# Patient Record
Sex: Female | Born: 1980 | Race: Black or African American | Hispanic: No | Smoking: Current every day smoker
Health system: Southern US, Community
[De-identification: ages and names within clinical notes are randomized; demographics above are authoritative.]

## PROBLEM LIST (undated history)

## (undated) ENCOUNTER — Inpatient Hospital Stay (HOSPITAL_COMMUNITY): Payer: Self-pay

## (undated) DIAGNOSIS — I10 Essential (primary) hypertension: Secondary | ICD-10-CM

## (undated) DIAGNOSIS — A749 Chlamydial infection, unspecified: Secondary | ICD-10-CM

## (undated) HISTORY — PX: WISDOM TOOTH EXTRACTION: SHX21

---

## 2006-07-15 ENCOUNTER — Emergency Department (HOSPITAL_COMMUNITY): Admission: EM | Admit: 2006-07-15 | Discharge: 2006-07-15 | Payer: Self-pay | Admitting: Emergency Medicine

## 2007-04-25 ENCOUNTER — Emergency Department (HOSPITAL_COMMUNITY): Admission: EM | Admit: 2007-04-25 | Discharge: 2007-04-25 | Payer: Self-pay | Admitting: Emergency Medicine

## 2007-10-12 ENCOUNTER — Inpatient Hospital Stay (HOSPITAL_COMMUNITY): Admission: AD | Admit: 2007-10-12 | Discharge: 2007-10-12 | Payer: Self-pay | Admitting: Obstetrics & Gynecology

## 2007-10-14 ENCOUNTER — Inpatient Hospital Stay (HOSPITAL_COMMUNITY): Admission: AD | Admit: 2007-10-14 | Discharge: 2007-10-14 | Payer: Self-pay | Admitting: Obstetrics

## 2007-10-16 ENCOUNTER — Inpatient Hospital Stay (HOSPITAL_COMMUNITY): Admission: AD | Admit: 2007-10-16 | Discharge: 2007-10-16 | Payer: Self-pay | Admitting: Obstetrics

## 2008-01-02 ENCOUNTER — Inpatient Hospital Stay (HOSPITAL_COMMUNITY): Admission: AD | Admit: 2008-01-02 | Discharge: 2008-01-02 | Payer: Self-pay | Admitting: Obstetrics

## 2008-01-14 ENCOUNTER — Inpatient Hospital Stay (HOSPITAL_COMMUNITY): Admission: AD | Admit: 2008-01-14 | Discharge: 2008-01-14 | Payer: Self-pay | Admitting: Obstetrics

## 2008-01-24 ENCOUNTER — Ambulatory Visit (HOSPITAL_COMMUNITY): Admission: RE | Admit: 2008-01-24 | Discharge: 2008-01-24 | Payer: Self-pay | Admitting: Obstetrics

## 2010-01-26 ENCOUNTER — Encounter: Payer: Self-pay | Admitting: Obstetrics

## 2010-04-21 LAB — URINE CULTURE: Colony Count: >=100000

## 2010-04-21 LAB — URINALYSIS, ROUTINE W REFLEX MICROSCOPIC
Nitrite: NEGATIVE
Specific Gravity, Urine: 1.02 (ref 1.005–1.030)

## 2010-04-21 LAB — GC/CHLAMYDIA PROBE AMP, GENITAL: Chlamydia, DNA Probe: NEGATIVE

## 2010-04-21 LAB — WET PREP, GENITAL
Trich, Wet Prep: NONE SEEN
Yeast Wet Prep HPF POC: NONE SEEN

## 2010-10-07 LAB — CBC
HCT: 38.6
Hemoglobin: 12.6
MCHC: 32.7
MCV: 86.7
RDW: 15.6 — ABNORMAL HIGH

## 2010-10-07 LAB — COMPREHENSIVE METABOLIC PANEL
Albumin: 3.7
BUN: 2 — ABNORMAL LOW
Calcium: 9
Chloride: 105
GFR calc Af Amer: >60
Potassium: 3.6
Total Protein: 6.9

## 2010-10-07 LAB — URINE MICROSCOPIC-ADD ON

## 2010-10-07 LAB — WET PREP, GENITAL: Yeast Wet Prep HPF POC: NONE SEEN

## 2010-10-07 LAB — URINALYSIS, ROUTINE W REFLEX MICROSCOPIC
Glucose, UA: NEGATIVE
Ketones, ur: 15 — AB
Specific Gravity, Urine: >1.03 — ABNORMAL HIGH
pH: 6

## 2010-10-07 LAB — HCG, QUANTITATIVE, PREGNANCY: hCG, Beta Chain, Quant, S: 4

## 2010-10-07 LAB — ABO/RH: ABO/RH(D): A POS

## 2010-10-07 LAB — GC/CHLAMYDIA PROBE AMP, GENITAL
Chlamydia, DNA Probe: NEGATIVE
GC Probe Amp, Genital: NEGATIVE

## 2010-10-07 LAB — POCT PREGNANCY, URINE: Preg Test, Ur: POSITIVE

## 2010-10-10 LAB — URINALYSIS, ROUTINE W REFLEX MICROSCOPIC
Bilirubin Urine: NEGATIVE
Glucose, UA: NEGATIVE mg/dL
Ketones, ur: NEGATIVE mg/dL
Nitrite: NEGATIVE
Protein, ur: NEGATIVE mg/dL
Specific Gravity, Urine: 1.025 (ref 1.005–1.030)

## 2015-04-18 DIAGNOSIS — K644 Residual hemorrhoidal skin tags: Secondary | ICD-10-CM | POA: Diagnosis not present

## 2015-04-18 DIAGNOSIS — K625 Hemorrhage of anus and rectum: Secondary | ICD-10-CM | POA: Diagnosis present

## 2015-04-18 NOTE — ED Notes (Signed)
Pt c/o 4 days of hemorrhoid pain with rectal bleeding

## 2015-04-19 ENCOUNTER — Encounter (HOSPITAL_BASED_OUTPATIENT_CLINIC_OR_DEPARTMENT_OTHER): Payer: Self-pay

## 2015-04-19 ENCOUNTER — Emergency Department (HOSPITAL_BASED_OUTPATIENT_CLINIC_OR_DEPARTMENT_OTHER)
Admission: EM | Admit: 2015-04-19 | Discharge: 2015-04-19 | Disposition: A | Discharge status: Home / Self Care | Payer: Medicaid Other | Attending: Emergency Medicine | Admitting: Emergency Medicine

## 2015-04-19 DIAGNOSIS — K644 Residual hemorrhoidal skin tags: Secondary | ICD-10-CM

## 2015-04-19 MED ORDER — LIDOCAINE 4 % EX CREA
TOPICAL_CREAM | CUTANEOUS | Status: DC | PRN
  Administered 2015-04-19: 1 via TOPICAL
  Filled 2015-04-19: qty 5

## 2015-04-19 MED ORDER — HYDROCORTISONE 2.5 % RE CREA: TOPICAL_CREAM | RECTAL | Status: DC

## 2015-04-19 MED ORDER — LIDOCAINE 4 % EX CREA: 1.0000 "application " | TOPICAL_CREAM | CUTANEOUS | Status: DC | PRN

## 2015-04-19 NOTE — ED Provider Notes (Signed)
CSN: [id number]     Arrival date & time 04/18/15  2353 History   First MD Initiated Contact with Patient 04/19/15 0019     Chief Complaint  Patient presents with  . Rectal Pain     (Consider location/radiation/quality/duration/timing/severity/associated sxs/prior Treatment) HPI  This is a 35 year old female with a history of hemorrhoids since having a baby 1 year ago. She is here with 4 days of enlargement and pain in her hemorrhoids. She rates her pain as severe, worse with sitting or bowel movements. She has tried multiple over-the-counter medications without relief. There has been some associated bleeding. She denies abdominal pain.  History reviewed. No pertinent past medical history. History reviewed. No pertinent past surgical history. No family history on file. Social History  Substance Use Topics  . Smoking status: None  . Smokeless tobacco: None  . Alcohol Use: None   OB History    No data available     Review of Systems  All other systems reviewed and are negative.   Allergies  Review of patient's allergies indicates no known allergies.  Home Medications   Prior to Admission medications   Not on File   BP 124/85 mmHg  Pulse 66  Temp(Src) 98.3 F (36.8 C) (Oral)  Resp 18  Ht 5\' 6"  (1.676 m)  Wt 177 lb (80.287 kg)  BMI 28.58 kg/m2  SpO2 99%  LMP 04/12/2015 (Approximate)   Physical Exam  General: Well-developed, well-nourished female in no acute distress; appearance consistent with age of record HENT: normocephalic; atraumatic Eyes: Normal appearance Neck: supple Heart: regular rate and rhythm Lungs: clear to auscultation bilaterally Abdomen: soft; nondistended; nontender Rectal: Two tender, inflamed, nonthrombosed external hemorrhoids Extremities: No deformity; full range of motion Neurologic: Awake, alert and oriented; motor function intact in all extremities and symmetric; no facial droop Skin: Warm and dry Psychiatric: Normal mood and  affect    ED Course  Procedures (including critical care time)   MDM     Paula LibraJohn Juna Caban, MD 04/19/15 [id number]

## 2015-04-19 NOTE — ED Notes (Signed)
MD at bedside. 

## 2015-04-19 NOTE — Discharge Instructions (Signed)

## 2015-11-26 ENCOUNTER — Encounter (HOSPITAL_COMMUNITY): Payer: Self-pay

## 2015-11-26 ENCOUNTER — Inpatient Hospital Stay (HOSPITAL_COMMUNITY)
Admission: AD | Admit: 2015-11-26 | Discharge: 2015-11-26 | Disposition: A | Discharge status: Home / Self Care | Payer: Medicaid Other | Source: Ambulatory Visit | Attending: Obstetrics & Gynecology | Admitting: Obstetrics & Gynecology

## 2015-11-26 ENCOUNTER — Inpatient Hospital Stay (HOSPITAL_COMMUNITY): Payer: Medicaid Other

## 2015-11-26 DIAGNOSIS — N760 Acute vaginitis: Secondary | ICD-10-CM | POA: Insufficient documentation

## 2015-11-26 DIAGNOSIS — O2312 Infections of bladder in pregnancy, second trimester: Secondary | ICD-10-CM | POA: Diagnosis not present

## 2015-11-26 DIAGNOSIS — Z3A19 19 weeks gestation of pregnancy: Secondary | ICD-10-CM | POA: Diagnosis not present

## 2015-11-26 DIAGNOSIS — O4692 Antepartum hemorrhage, unspecified, second trimester: Secondary | ICD-10-CM

## 2015-11-26 DIAGNOSIS — N3000 Acute cystitis without hematuria: Secondary | ICD-10-CM | POA: Insufficient documentation

## 2015-11-26 DIAGNOSIS — O209 Hemorrhage in early pregnancy, unspecified: Secondary | ICD-10-CM | POA: Diagnosis not present

## 2015-11-26 DIAGNOSIS — F17210 Nicotine dependence, cigarettes, uncomplicated: Secondary | ICD-10-CM | POA: Insufficient documentation

## 2015-11-26 DIAGNOSIS — O23592 Infection of other part of genital tract in pregnancy, second trimester: Secondary | ICD-10-CM | POA: Diagnosis not present

## 2015-11-26 DIAGNOSIS — O99332 Smoking (tobacco) complicating pregnancy, second trimester: Secondary | ICD-10-CM | POA: Diagnosis not present

## 2015-11-26 DIAGNOSIS — B9689 Other specified bacterial agents as the cause of diseases classified elsewhere: Secondary | ICD-10-CM | POA: Diagnosis not present

## 2015-11-26 DIAGNOSIS — R109 Unspecified abdominal pain: Secondary | ICD-10-CM | POA: Diagnosis present

## 2015-11-26 HISTORY — DX: Essential (primary) hypertension: I10

## 2015-11-26 LAB — URINE MICROSCOPIC-ADD ON

## 2015-11-26 LAB — URINALYSIS, ROUTINE W REFLEX MICROSCOPIC
BILIRUBIN URINE: NEGATIVE
Glucose, UA: NEGATIVE mg/dL
Ketones, ur: 15 mg/dL — AB
Nitrite: POSITIVE — AB
PH: 7.5 (ref 5.0–8.0)
Protein, ur: 100 mg/dL — AB
SPECIFIC GRAVITY, URINE: 1.015 (ref 1.005–1.030)

## 2015-11-26 LAB — WET PREP, GENITAL
SPERM: NONE SEEN
Trich, Wet Prep: NONE SEEN
YEAST WET PREP: NONE SEEN

## 2015-11-26 MED ORDER — NITROFURANTOIN MONOHYD MACRO 100 MG PO CAPS
100.0000 mg | ORAL_CAPSULE | Freq: Once | ORAL | Status: AC
  Administered 2015-11-26: 100 mg via ORAL
  Filled 2015-11-26: qty 1

## 2015-11-26 MED ORDER — METRONIDAZOLE 500 MG PO TABS: 500.0000 mg | ORAL_TABLET | Freq: Two times a day (BID) | ORAL | 0 refills | Status: DC

## 2015-11-26 MED ORDER — NITROFURANTOIN MONOHYD MACRO 100 MG PO CAPS: 100.0000 mg | ORAL_CAPSULE | Freq: Two times a day (BID) | ORAL | 0 refills | Status: DC

## 2015-11-26 NOTE — MAU Provider Note (Signed)
History     CSN: [id number]  Arrival date and time: 11/26/15 1736   First Provider Initiated Contact with Patient 11/26/15 1807      Chief Complaint  Patient presents with  . Abdominal Pain  . Vaginal Bleeding   HPI  Lisa Hunt is a 35 y.o. [id number] at 6346w5d who presents with abdominal pain & vaginal bleeding. Patient reports symptoms of UTI for the last 3 days, complaining of urgency & frequency. Denies dysuria, hematuria, fever/chills, or flank pain.  Lower abdominal pain since this morning that is constant & cramp like. Rates pain 7/10. Has not treated. Nothing makes better or worse. Denies n/v/d, LOF, or vaginal discharge. Some vaginal spotting today that appears bright red. Passed small bright red blot that was the size of a nickel. Last intercourse yesterday. Last BM was this morning;had to strain. Has first prenatal visit with [facility] tomorrow.  OB History    Gravida Para Term Preterm AB Living   10       5 4    SAB TAB Ectopic Multiple Live Births   2 3            Past Medical History:  Diagnosis Date  . Hypertension     Past Surgical History:  Procedure Laterality Date  . WISDOM TOOTH EXTRACTION      History reviewed. No pertinent family history.  Social History  Substance Use Topics  . Smoking status: Current Every Day Smoker    Packs/day: 0.25    Types: Cigarettes  . Smokeless tobacco: Current User  . Alcohol use No    Allergies: No Known Allergies  No prescriptions prior to admission.    Review of Systems  Constitutional: Negative for chills and fever.  Gastrointestinal: Positive for abdominal pain and constipation. Negative for diarrhea, nausea and vomiting.  Genitourinary: Positive for frequency and urgency. Negative for dysuria, flank pain and hematuria.       + vaginal bleeding  Musculoskeletal: Negative for back pain.   Physical Exam   Blood pressure 125/81, pulse 72, temperature 98.2 F (36.8 C), temperature source Oral, resp. rate  18, height 5\' 6"  (1.676 m), weight 190 lb (86.2 kg), last menstrual period 07/11/2015.  Physical Exam  Nursing note and vitals reviewed. Constitutional: She is oriented to person, place, and time. She appears well-developed and well-nourished. No distress.  HENT:  Head: Normocephalic and atraumatic.  Eyes: Conjunctivae are normal. Right eye exhibits no discharge. Left eye exhibits no discharge. No scleral icterus.  Neck: Normal range of motion.  Cardiovascular: Normal rate, regular rhythm and normal heart sounds.   No murmur heard. Respiratory: Effort normal and breath sounds normal. No respiratory distress. She has no wheezes.  GI: Soft. There is no tenderness.  Fundal height at umbilicus  Genitourinary: No bleeding in the vagina. Vaginal discharge (small amount of yellow frothy discharge) found.  Genitourinary Comments: Cervix visually closed  Neurological: She is alert and oriented to person, place, and time.  Skin: Skin is warm and dry. She is not diaphoretic.  Psychiatric: She has a normal mood and affect. Her behavior is normal. Judgment and thought content normal.    MAU Course  Procedures Results for orders placed or performed during the hospital encounter of 11/26/15 (from the past 24 hour(s))  Urinalysis, Routine w reflex microscopic (not [facility])     Status: Abnormal   Collection Time: 11/26/15  5:58 PM  Result Value Ref Range   Color, Urine YELLOW YELLOW   APPearance CLOUDY (A)  CLEAR   Specific Gravity, Urine 1.015 1.005 - 1.030   pH 7.5 5.0 - 8.0   Glucose, UA NEGATIVE NEGATIVE mg/dL   Hgb urine dipstick LARGE (A) NEGATIVE   Bilirubin Urine NEGATIVE NEGATIVE   Ketones, ur 15 (A) NEGATIVE mg/dL   Protein, ur 657100 (A) NEGATIVE mg/dL   Nitrite POSITIVE (A) NEGATIVE   Leukocytes, UA LARGE (A) NEGATIVE  Urine microscopic-add on     Status: Abnormal   Collection Time: 11/26/15  5:58 PM  Result Value Ref Range   Squamous Epithelial / LPF 0-5 (A) NONE SEEN   WBC, UA TOO  NUMEROUS TO COUNT 0 - 5 WBC/hpf   RBC / HPF 6-30 0 - 5 RBC/hpf   Bacteria, UA FEW (A) NONE SEEN  Wet prep, genital     Status: Abnormal   Collection Time: 11/26/15  6:18 PM  Result Value Ref Range   Yeast Wet Prep HPF POC NONE SEEN NONE SEEN   Trich, Wet Prep NONE SEEN NONE SEEN   Clue Cells Wet Prep HPF POC PRESENT (A) NONE SEEN   WBC, Wet Prep HPF POC MANY (A) NONE SEEN   Sperm NONE SEEN     MDM FHT 160 by doppler GC/CT & wet prep A positive Ultrasound ordered d/t bleeding -- no evidence of previa or abruption; normal cervical length Pt has transportation issues & can't p/u abx tonight; will give first dose of macrobid prior to discharge  Assessment and Plan  A:  1. Acute cystitis during pregnancy in second trimester   2. [redacted] weeks gestation of pregnancy   3. Vaginal bleeding in pregnancy, second trimester   4. BV (bacterial vaginosis)    P: Discharge home Rx flagyl & macrobid Urine culture pending GC/Ct pending Discussed reasons to return to MAU Keep ob appt tomorrow  Judeth Hornrin Donnelle Rubey 11/26/2015, 6:07 PM

## 2015-11-26 NOTE — MAU Note (Signed)
Pt presents to MAU by EMS with abdominal cramping/pressure and spotting. Spotting began yesterday, and passed one quarter sized clot today. Reports bleeding is only when wiping but, pt noticed blood is now a brighter red. Lower abdominal pain started today. States, it feels like she has a UTI. Denies any abnormal discharge or leaking of fluid.

## 2015-11-26 NOTE — Discharge Instructions (Signed)
Pregnancy and Urinary Tract Infection °WHAT IS A URINARY TRACT INFECTION? °A urinary tract infection (UTI) is an infection of any part of the urinary tract. This includes the kidneys, the tubes that connect your kidneys to your bladder (ureters), the bladder, and the tube that carries urine out of your body (urethra). These organs make, store, and get rid of urine in the body. A UTI can be a bladder infection (cystitis) or a kidney infection (pyelonephritis). This infection may be caused by fungi, viruses, and bacteria. Bacteria are the most common cause of UTIs. °You are more likely to develop a UTI during pregnancy because: °· The physical and hormonal changes your body goes through can make it easier for bacteria to get into your urinary tract. °· Your growing baby puts pressure on your uterus and can affect urine flow. °DOES A UTI PLACE MY BABY AT RISK? °An untreated UTI during pregnancy could lead to a kidney infection, which can cause health problems that could affect your baby. Possible complications of an untreated UTI include: °· Having your baby before 37 weeks of pregnancy (premature). °· Having a baby with a low birth weight. °· Developing high blood pressure during pregnancy (preeclampsia). °WHAT ARE THE SYMPTOMS OF A UTI? °Symptoms of a UTI include: °· Fever. °· Frequent urination or passing small amounts of urine frequently. °· Needing to urinate urgently. °· Pain or a burning sensation with urination. °· Urine that smells bad or unusual. °· Cloudy urine. °· Pain in the lower abdomen or back. °· Trouble urinating. °· Blood in the urine. °· Vomiting or being less hungry than normal. °· Diarrhea or abdominal pain. °· Vaginal discharge. °WHAT ARE THE TREATMENT OPTIONS FOR A UTI DURING PREGNANCY? °Treatment for this condition may include: °· Antibiotic medicines that are safe to take during pregnancy. °· Other medicines to treat less common causes of UTI. °HOW CAN I PREVENT A UTI? °To prevent a UTI: °· Go  to the bathroom as soon as you feel the need. °· Always wipe from front to back. °· Wash your genital area with soap and warm water daily. °· Empty your bladder before and after sex. °· Wear cotton underwear. °· Limit your intake of high sugar foods or drinks, such as regular soda, juice, and sweets.. °· Drink 6-8 glasses of water daily. °· Do not wear tight-fitting pants. °· Do not douche or use deodorant sprays. °· Do not drink alcohol, caffeine, or carbonated drinks. These can irritate the bladder. °WHEN SHOULD I SEEK MEDICAL CARE? °Seek medical care if: °· Your symptoms do not improve or get worse. °· You have a fever after two days of treatment. °· You have a rash. °· You have abnormal vaginal discharge. °· You have back or side pain. °· You have chills. °· You have nausea and vomiting. °WHEN SHOULD I SEEK IMMEDIATE MEDICAL CARE? °Seek immediate medical care if you are pregnant and: °· You feel contractions in your uterus. °· You have lower belly pain. °· You have a gush of fluid from your vagina. °· You have blood in your urine. °· You are vomiting and cannot keep down any medicines or water. °This information is not intended to replace advice given to you by your health care provider. Make sure you discuss any questions you have with your health care provider. °Document Released: 04/18/2010 Document Revised: 05/27/2015 Document Reviewed: 11/12/2014 °Elsevier Interactive Patient Education © 2017 [employer] ° °

## 2015-11-27 ENCOUNTER — Telehealth (HOSPITAL_COMMUNITY): Payer: Self-pay | Admitting: *Deleted

## 2015-11-27 LAB — GC/CHLAMYDIA PROBE AMP (~~LOC~~) NOT AT ARMC
CHLAMYDIA, DNA PROBE: POSITIVE — AB
Neisseria Gonorrhea: NEGATIVE

## 2015-11-27 NOTE — Telephone Encounter (Signed)
Attempted to call pt. To inform of positive [place] results, no answer or message available on all contact phone numbers.  Will notify provider and send certified mail.  Form completed and sent to Health dept.

## 2015-11-29 LAB — CULTURE, OB URINE: Culture: >=100000 — AB

## 2015-11-30 ENCOUNTER — Telehealth: Payer: Self-pay | Admitting: Student

## 2015-11-30 DIAGNOSIS — O2342 Unspecified infection of urinary tract in pregnancy, second trimester: Secondary | ICD-10-CM

## 2015-11-30 MED ORDER — CEPHALEXIN 500 MG PO CAPS: 500.0000 mg | ORAL_CAPSULE | Freq: Four times a day (QID) | ORAL | 0 refills | Status: DC

## 2015-11-30 NOTE — Telephone Encounter (Signed)
Needs change in therapy d/t results of urine culture. Attempted to contact patient. No answer & no option for voicemail. New rx sent to pharmacy on file.

## 2015-12-06 ENCOUNTER — Encounter (HOSPITAL_COMMUNITY): Payer: Self-pay

## 2015-12-16 LAB — OB RESULTS CONSOLE HEPATITIS B SURFACE ANTIGEN: Hepatitis B Surface Ag: NEGATIVE

## 2015-12-16 LAB — OB RESULTS CONSOLE RUBELLA ANTIBODY, IGM: Rubella: IMMUNE

## 2015-12-16 LAB — OB RESULTS CONSOLE HIV ANTIBODY (ROUTINE TESTING): HIV: NONREACTIVE

## 2015-12-16 LAB — OB RESULTS CONSOLE RPR: RPR: NONREACTIVE

## 2015-12-17 ENCOUNTER — Other Ambulatory Visit: Payer: Self-pay | Admitting: Nurse Practitioner

## 2015-12-25 ENCOUNTER — Other Ambulatory Visit: Payer: Self-pay | Admitting: Obstetrics & Gynecology

## 2015-12-25 DIAGNOSIS — Z3689 Encounter for other specified antenatal screening: Secondary | ICD-10-CM

## 2016-01-03 ENCOUNTER — Other Ambulatory Visit: Payer: Self-pay | Admitting: Obstetrics & Gynecology

## 2016-01-03 ENCOUNTER — Ambulatory Visit (HOSPITAL_COMMUNITY)
Admission: RE | Admit: 2016-01-03 | Discharge: 2016-01-03 | Disposition: A | Discharge status: Home / Self Care | Payer: Medicaid Other | Source: Ambulatory Visit | Attending: Obstetrics & Gynecology | Admitting: Obstetrics & Gynecology

## 2016-01-03 DIAGNOSIS — O402XX0 Polyhydramnios, second trimester, not applicable or unspecified: Secondary | ICD-10-CM

## 2016-01-03 DIAGNOSIS — Z363 Encounter for antenatal screening for malformations: Secondary | ICD-10-CM | POA: Diagnosis not present

## 2016-01-03 DIAGNOSIS — Z3A25 25 weeks gestation of pregnancy: Secondary | ICD-10-CM

## 2016-01-03 DIAGNOSIS — Z3689 Encounter for other specified antenatal screening: Secondary | ICD-10-CM

## 2016-01-06 NOTE — L&D Delivery Note (Signed)
36 yo G10 now P5 at [redacted]w[redacted]d admitted in labor. Augmented with AROM.  Delivery Note At 7:43 PM a viable female was delivered via Vaginal, Spontaneous Delivery s/p AROM Presentation:ROA .  APGAR:9 ,9 ; weight pending  .   Placenta status: Delivery intact with gentle traction , .  Cord: no nuchal cord  with the following complications: .  Cord pH: pending   Anesthesia: Epidural   Episiotomy: None Lacerations: None Est. Blood Loss (mL): 150  Mom to postpartum.  Baby to Couplet care / Skin to Skin.  Abdoulaye Diallo 04/06/2016, 7:51 PM  OB FELLOW DELIVERY ATTESTATION  I was gloved and present for the delivery in its entirety, and I agree with the above resident's note.    Ernestina Penna, MD 8:34 PM

## 2016-01-07 ENCOUNTER — Other Ambulatory Visit (HOSPITAL_COMMUNITY): Payer: Self-pay | Admitting: *Deleted

## 2016-01-07 DIAGNOSIS — O409XX0 Polyhydramnios, unspecified trimester, not applicable or unspecified: Secondary | ICD-10-CM

## 2016-01-24 ENCOUNTER — Ambulatory Visit (HOSPITAL_COMMUNITY): Payer: Medicaid Other

## 2016-01-27 ENCOUNTER — Encounter (HOSPITAL_COMMUNITY): Payer: Self-pay

## 2016-01-28 ENCOUNTER — Encounter (HOSPITAL_COMMUNITY): Payer: Self-pay

## 2016-01-28 ENCOUNTER — Inpatient Hospital Stay (HOSPITAL_COMMUNITY)
Admission: RE | Admit: 2016-01-28 | Discharge: 2016-01-28 | Disposition: A | Discharge status: Home / Self Care | Payer: Medicaid Other | Source: Ambulatory Visit | Attending: Maternal and Fetal Medicine | Admitting: Maternal and Fetal Medicine

## 2016-02-07 LAB — OB RESULTS CONSOLE GC/CHLAMYDIA
CHLAMYDIA, DNA PROBE: NEGATIVE
GC PROBE AMP, GENITAL: NEGATIVE

## 2016-02-17 ENCOUNTER — Inpatient Hospital Stay (HOSPITAL_COMMUNITY)
Admission: AD | Admit: 2016-02-17 | Discharge: 2016-02-17 | Disposition: A | Discharge status: Home / Self Care | Payer: Medicaid Other | Source: Ambulatory Visit | Attending: Obstetrics and Gynecology | Admitting: Obstetrics and Gynecology

## 2016-02-17 ENCOUNTER — Encounter (HOSPITAL_COMMUNITY): Payer: Self-pay

## 2016-02-17 DIAGNOSIS — W1839XA Other fall on same level, initial encounter: Secondary | ICD-10-CM | POA: Insufficient documentation

## 2016-02-17 DIAGNOSIS — F17210 Nicotine dependence, cigarettes, uncomplicated: Secondary | ICD-10-CM | POA: Insufficient documentation

## 2016-02-17 DIAGNOSIS — Z3A31 31 weeks gestation of pregnancy: Secondary | ICD-10-CM | POA: Insufficient documentation

## 2016-02-17 DIAGNOSIS — O26893 Other specified pregnancy related conditions, third trimester: Secondary | ICD-10-CM | POA: Insufficient documentation

## 2016-02-17 DIAGNOSIS — O99333 Smoking (tobacco) complicating pregnancy, third trimester: Secondary | ICD-10-CM | POA: Insufficient documentation

## 2016-02-17 LAB — URINALYSIS, ROUTINE W REFLEX MICROSCOPIC
BILIRUBIN URINE: NEGATIVE
Glucose, UA: NEGATIVE mg/dL
Hgb urine dipstick: NEGATIVE
KETONES UR: 5 mg/dL — AB
Leukocytes, UA: NEGATIVE
Nitrite: NEGATIVE
Protein, ur: 30 mg/dL — AB
Specific Gravity, Urine: 1.019 (ref 1.005–1.030)
pH: 6 (ref 5.0–8.0)

## 2016-02-17 NOTE — MAU Provider Note (Signed)
Chief Complaint:  Fall   First Provider Initiated Contact with Patient 02/17/16 2329      HPI: Lisa Hunt is a 36 y.o. [id number] at 55w4dwho presents to maternity admissions reporting she fell while playing with her kids at home today and hit her abdomen on the couch.  She reports some abdominal pain and pelvic pressure that are new onset after her fall.  The pain is intermittent,low in her pelvis and intermittent, increased with movement, and relieved by rest.   She reports good fetal movement, denies LOF, vaginal bleeding, vaginal itching/burning, urinary symptoms, h/a, dizziness, n/v, or fever/chills.    HPI  Past Medical History: Past Medical History:  Diagnosis Date  . Hypertension     Past obstetric history: OB History  Gravida Para Term Preterm AB Living  10 4 4   5 4   SAB TAB Ectopic Multiple Live Births  2 3          # Outcome Date GA Lbr Len/2nd Weight Sex Delivery Anes PTL Lv  10 Current           9 Term           8 Term           7 Term           6 Term           5 TAB           4 TAB           3 TAB           2 SAB           1 SAB               Past Surgical History: Past Surgical History:  Procedure Laterality Date  . WISDOM TOOTH EXTRACTION      Family History: No family history on file.  Social History: Social History  Substance Use Topics  . Smoking status: Current Every Day Smoker    Packs/day: 0.25    Types: Cigarettes  . Smokeless tobacco: Current User  . Alcohol use No    Allergies: No Known Allergies  Meds:  Prescriptions Prior to Admission  Medication Sig Dispense Refill Last Dose  . clotrimazole (GYNE-LOTRIMIN) 1 % vaginal cream Place 1 Applicatorful vaginally at bedtime.   02/17/2016 at Unknown time  . acetaminophen (TYLENOL) 500 MG tablet Take 500 mg by mouth every 6 (six) hours as needed for mild pain.   11/26/2015 at 1430  . cephALEXin (KEFLEX) 500 MG capsule Take 1 capsule (500 mg total) by mouth 4 (four) times daily. 28  capsule 0   . metroNIDAZOLE (FLAGYL) 500 MG tablet Take 1 tablet (500 mg total) by mouth 2 (two) times daily. 14 tablet 0     ROS:  Review of Systems  Constitutional: Negative for chills, fatigue and fever.  Eyes: Negative for visual disturbance.  Respiratory: Negative for shortness of breath.   Cardiovascular: Negative for chest pain.  Gastrointestinal: Positive for abdominal pain. Negative for nausea and vomiting.  Genitourinary: Positive for pelvic pain. Negative for difficulty urinating, dysuria, flank pain, vaginal bleeding, vaginal discharge and vaginal pain.  Neurological: Negative for dizziness and headaches.  Psychiatric/Behavioral: Negative.      I have reviewed patient's Past Medical Hx, Surgical Hx, Family Hx, Social Hx, medications and allergies.   Physical Exam   Patient Vitals for the past 24 hrs:  BP Temp Temp src Pulse Resp SpO2  02/17/16 2213 129/89 98.1 F (36.7 C) Oral 85 16 97 %   Constitutional: Well-developed, well-nourished female in no acute distress.  Cardiovascular: normal rate Respiratory: normal effort GI: Abd soft, non-tender, gravid appropriate for gestational age.  MS: Extremities nontender, no edema, normal ROM Neurologic: Alert and oriented x 4.  GU: Neg CVAT.    Dilation: Fingertip Effacement (%): Thick Cervical Position: Posterior Exam by:: Leftwich-Kirby, CNM  FHT:  Baseline 135 , moderate variability, accelerations present, no decelerations Contractions: None on toco or to palpation   Labs: Results for orders placed or performed during the hospital encounter of 02/17/16 (from the past 24 hour(s))  Urinalysis, Routine w reflex microscopic     Status: Abnormal   Collection Time: 02/17/16 10:00 PM  Result Value Ref Range   Color, Urine YELLOW YELLOW   APPearance HAZY (A) CLEAR   Specific Gravity, Urine 1.019 1.005 - 1.030   pH 6.0 5.0 - 8.0   Glucose, UA NEGATIVE NEGATIVE mg/dL   Hgb urine dipstick NEGATIVE NEGATIVE   Bilirubin  Urine NEGATIVE NEGATIVE   Ketones, ur 5 (A) NEGATIVE mg/dL   Protein, ur 30 (A) NEGATIVE mg/dL   Nitrite NEGATIVE NEGATIVE   Leukocytes, UA NEGATIVE NEGATIVE   RBC / HPF 0-5 0 - 5 RBC/hpf   WBC, UA 0-5 0 - 5 WBC/hpf   Bacteria, UA RARE (A) NONE SEEN   Squamous Epithelial / LPF 0-5 (A) NONE SEEN   Mucous PRESENT    Hyaline Casts, UA PRESENT       Imaging:  No results found.  MAU Course/MDM: I have ordered labs and reviewed results.  NST reviewed and reactive Pt does not desire to stay 4 hours for recommended monitoring after her fall.  Her kids and husband are waiting outside in the car due to hospital influenza precautions.  Consult Dr Alysia PennaErvin.  Recommend 4 hours of monitoring so unable to discharge pt until this is complete. Pt signed AMA papers and left MAU tonight. Preterm labor and abruption precautions reviewed related to fall Also pt reports [facility] and [facility].   [facility]  Pt stable when leaving MAU.    Sharen CounterLisa Leftwich-Kirby Certified Nurse-Midwife 02/17/2016 11:42 PM

## 2016-02-17 NOTE — MAU Note (Signed)
Pt states that she was playing with her kids and tripped and fell. States that the couch broke her fall and she hit her stomach on the couch and then ended up on the floor on her side. Denies vaginal bleeding, LOF or contractions. +FM. Having a lot of pelvic pressure.

## 2016-02-18 ENCOUNTER — Other Ambulatory Visit (HOSPITAL_COMMUNITY): Payer: Self-pay | Admitting: Maternal and Fetal Medicine

## 2016-02-18 ENCOUNTER — Ambulatory Visit (HOSPITAL_COMMUNITY)
Admission: RE | Admit: 2016-02-18 | Discharge: 2016-02-18 | Disposition: A | Discharge status: Home / Self Care | Payer: Medicaid Other | Source: Ambulatory Visit | Attending: Obstetrics & Gynecology | Admitting: Obstetrics & Gynecology

## 2016-02-18 DIAGNOSIS — O409XX0 Polyhydramnios, unspecified trimester, not applicable or unspecified: Secondary | ICD-10-CM

## 2016-02-18 DIAGNOSIS — O403XX0 Polyhydramnios, third trimester, not applicable or unspecified: Secondary | ICD-10-CM | POA: Insufficient documentation

## 2016-02-18 DIAGNOSIS — Z3A31 31 weeks gestation of pregnancy: Secondary | ICD-10-CM

## 2016-02-18 DIAGNOSIS — IMO0002 Reserved for concepts with insufficient information to code with codable children: Secondary | ICD-10-CM

## 2016-02-18 DIAGNOSIS — Z0489 Encounter for examination and observation for other specified reasons: Secondary | ICD-10-CM

## 2016-02-18 DIAGNOSIS — Z362 Encounter for other antenatal screening follow-up: Secondary | ICD-10-CM | POA: Insufficient documentation

## 2016-02-26 ENCOUNTER — Telehealth: Payer: Self-pay

## 2016-02-26 NOTE — Telephone Encounter (Signed)
Left message for pt to call our office to set up new ob appt.

## 2016-02-28 ENCOUNTER — Encounter (HOSPITAL_COMMUNITY): Payer: Self-pay

## 2016-03-13 LAB — OB RESULTS CONSOLE GBS: STREP GROUP B AG: POSITIVE

## 2016-04-05 ENCOUNTER — Encounter (HOSPITAL_COMMUNITY): Payer: Self-pay

## 2016-04-05 ENCOUNTER — Emergency Department (HOSPITAL_COMMUNITY)
Admission: EM | Admit: 2016-04-05 | Discharge: 2016-04-05 | Disposition: A | Discharge status: Home / Self Care | Payer: Medicaid Other | Source: Home / Self Care | Attending: Emergency Medicine | Admitting: Emergency Medicine

## 2016-04-05 DIAGNOSIS — I10 Essential (primary) hypertension: Secondary | ICD-10-CM

## 2016-04-05 DIAGNOSIS — O99343 Other mental disorders complicating pregnancy, third trimester: Secondary | ICD-10-CM

## 2016-04-05 DIAGNOSIS — O99333 Smoking (tobacco) complicating pregnancy, third trimester: Secondary | ICD-10-CM

## 2016-04-05 DIAGNOSIS — S0081XA Abrasion of other part of head, initial encounter: Secondary | ICD-10-CM | POA: Insufficient documentation

## 2016-04-05 DIAGNOSIS — Y999 Unspecified external cause status: Secondary | ICD-10-CM | POA: Insufficient documentation

## 2016-04-05 DIAGNOSIS — O9A213 Injury, poisoning and certain other consequences of external causes complicating pregnancy, third trimester: Secondary | ICD-10-CM | POA: Insufficient documentation

## 2016-04-05 DIAGNOSIS — Y929 Unspecified place or not applicable: Secondary | ICD-10-CM | POA: Insufficient documentation

## 2016-04-05 DIAGNOSIS — Z3A38 38 weeks gestation of pregnancy: Secondary | ICD-10-CM

## 2016-04-05 DIAGNOSIS — Z79899 Other long term (current) drug therapy: Secondary | ICD-10-CM | POA: Insufficient documentation

## 2016-04-05 DIAGNOSIS — R4589 Other symptoms and signs involving emotional state: Secondary | ICD-10-CM

## 2016-04-05 DIAGNOSIS — Y939 Activity, unspecified: Secondary | ICD-10-CM

## 2016-04-05 DIAGNOSIS — F17210 Nicotine dependence, cigarettes, uncomplicated: Secondary | ICD-10-CM

## 2016-04-05 DIAGNOSIS — F329 Major depressive disorder, single episode, unspecified: Secondary | ICD-10-CM | POA: Insufficient documentation

## 2016-04-05 DIAGNOSIS — Z3493 Encounter for supervision of normal pregnancy, unspecified, third trimester: Secondary | ICD-10-CM

## 2016-04-05 LAB — COMPREHENSIVE METABOLIC PANEL
ALBUMIN: 2.9 g/dL — AB (ref 3.5–5.0)
ALT: 15 U/L (ref 14–54)
AST: 30 U/L (ref 15–41)
Alkaline Phosphatase: 129 U/L — ABNORMAL HIGH (ref 38–126)
Anion gap: 8 (ref 5–15)
BUN: 8 mg/dL (ref 6–20)
CALCIUM: 8.8 mg/dL — AB (ref 8.9–10.3)
CO2: 23 mmol/L (ref 22–32)
CREATININE: 0.66 mg/dL (ref 0.44–1.00)
Chloride: 105 mmol/L (ref 101–111)
Glucose, Bld: 99 mg/dL (ref 65–99)
Potassium: 3.9 mmol/L (ref 3.5–5.1)
SODIUM: 136 mmol/L (ref 135–145)
Total Bilirubin: 0.6 mg/dL (ref 0.3–1.2)
Total Protein: 7 g/dL (ref 6.5–8.1)

## 2016-04-05 LAB — CBC
HCT: 35 % — ABNORMAL LOW (ref 36.0–46.0)
Hemoglobin: 11.6 g/dL — ABNORMAL LOW (ref 12.0–15.0)
MCH: 27.4 pg (ref 26.0–34.0)
MCHC: 33.1 g/dL (ref 30.0–36.0)
MCV: 82.5 fL (ref 78.0–100.0)
PLATELETS: 113 10*3/uL — AB (ref 150–400)
RBC: 4.24 MIL/uL (ref 3.87–5.11)
RDW: 13.7 % (ref 11.5–15.5)
WBC: 8.4 10*3/uL (ref 4.0–10.5)

## 2016-04-05 LAB — ETHANOL

## 2016-04-05 LAB — RAPID URINE DRUG SCREEN, HOSP PERFORMED
AMPHETAMINES: NOT DETECTED
Barbiturates: NOT DETECTED
Benzodiazepines: NOT DETECTED
Cocaine: NOT DETECTED
OPIATES: NOT DETECTED
TETRAHYDROCANNABINOL: POSITIVE — AB

## 2016-04-05 LAB — ACETAMINOPHEN LEVEL

## 2016-04-05 LAB — SALICYLATE LEVEL
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
## 2016-04-05 NOTE — ED Notes (Signed)
TTS PRESENT TO SPEAK WITH PT 

## 2016-04-05 NOTE — ED Notes (Signed)
EDPA Chrissie Noa Provider at bedside.EDP ALLEN PRESENT

## 2016-04-05 NOTE — ED Notes (Signed)
EDPA Provider at bedside. 

## 2016-04-05 NOTE — ED Notes (Signed)
PT RECEIVED CALL FROM CHILDREN'S FATHER. AFTER PHONE CALL, PT AGITATED AND STATES SHE HAS TO LEAVE. 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 PT STATES SHE HAS NOT FRIENDS AND FAMILY LOCAL. PT ENCOURAGED TO CALL FAMILY AND SUPPORT TO HELP. PT STATES SHE HAS NONE OF THESE. ENCOURAGED PT TO STAY UNTIL SHE COULD HAVE SOMEONE (TTS) EVALUATE. PT AGREED AT THIS TIME.

## 2016-04-05 NOTE — ED Notes (Signed)
FETAL HEART TONES 146
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
## 2016-04-05 NOTE — ED Notes (Signed)
[facility] called at 10:38am, stated that she is currently at [facility] monitoring a patient may be this afternoon before she can get here. RN notified. OB stated to call back if status of patient changes.

## 2016-04-05 NOTE — Discharge Instructions (Signed)
Substance Abuse Treatment Programs ° °Intensive Outpatient Programs °[facility]     °[address]      °[place], [place]                   °[phone]      ° °[facility] °[address] °[place], [place] °[phone] ° °[facility]     °(Inpatient and outpatient)     °[address]           °[phone]   ° °[facility] °[phone] (Suboxone and Methadone) ° °[address]      °[place], [place] [address]      °[phone]      ° °[address] °[place], [place] °852-3033 ° °[facility] (Outpatient/Inpatient, Chemical)    °(insurance only) [phone]      °       °[facility] (Groups & Residential) °[place], [place] °[phone] ° °   °[facility]     °[address]     °[place], [place]      °[phone]      ° °[facility] (for caregivers and family) °[address] °[place], [place] °336-299-4655 ° ° ° ° ° °Residential Treatment Programs °[facility]      °[address], [place], [place] [address]  °[phone]      ° °[facility] °[address] [place], [place] [address] °[phone] ° °[facility]        °[phone]      ° °[facility] °[phone] ° °[facility]             °[address]                                         °[place], [place]                                                °[phone] or [phone]                              ° °[facility] [place] °[address] °[place], [address] °[phone] ° °[facility]    °[address]      °[place], [place]     °[phone]      ° °[facility] °[address] °[place], [place] °[phone] ° °[facility]   °[address]     °[place], [place] [address]     °[phone]      °Admissions: 8am-3pm M-F ° °[facility] °[address] °[place],  [place] °[phone] ° °[facility]: Residential Program (90 Days)   °[place], [place]      °336-725-8389 or 800-758-6077    ° °[facility] °[place], [place] °(Walk in Hours over the weekend or by referral) ° °[facility] °[address], [place], [place] [address] °([phone] ° °Crisis Mobile: [facility]:  1-877-626-1772 (for crisis response 24 hours a day) °[facility] Hotline:      1-800-256-2452 °Outpatient Psychiatry and Counseling ° °[facility]: Mobile Crisis   Management 24 hours:  1-877-626-1772 ° °[facility] sliding scale fee and walk in schedule: M-F 8am-12pm/1pm-3pm °[address]  °[place], [place] [address] °[phone] ° °[facility] °[address] °[place], [place] [address] °[phone] ° °[facility] ([facility] known [facility])- new patient walk-in appointments available Monday - Friday 8am -3pm.          °[address] °[place], [place] [address] °336-676-6840 or crisis line- 336-676-6905 ° °[facility] Intensive Outpatient Therapy Program °[address] °[place], [place] [address] °336-832-9804 ° °[facility]                  °Crisis Services      °[phone]      °[address]     °[place], [place] [address]                ° °[facility]   °[facility] °[phone] °[address] °[place], [place] [address] ° ° °[facility]          °[address],  °[place], [place] [address]       °([phone] ° °[facility] °[address] °[place], [place] [address] °336-292-1510 ° °[facility]    °[address]    °[place], [place] [address]     °[phone]      ° °Parish McKinney, [place]     °[address]     °[place] [address]     °336-282-1251     °  °[facility] °[address] °[place] [address] ° °[facility]     °[address]     °[place], [place]      °336-542-2076      ° °[facility] °Shamsher Ahluwalia, MD °[address] °[place], [place] [address] °[phone] ° °[facility]     °[address]     °[place], [place] [address]     °336-274-1237      ° °[facility] °[address] °[place], [place] [address] °336-854-4450 °Resources for Temporary Residential Assistance/Crisis Centers ° °DAY CENTERS °[facility] °M-F 8am-3pm   °[address] [place], [place] [address]   336-332-0824 °Services include: laundry, barbering, support groups, case management, phone  & computer access, showers, AA/NA mtgs, mental health/substance abuse nurse, job skills class, disability information, VA assistance, spiritual classes, etc.  ° °HOMELESS SHELTERS ° °[facility]     °[facility]   °[address], [place]     °[phone]       °       °[facility] (women and children)       °[address] °[place], [place] [address] °336-275-0820 °[email or web address] for application and process °Application Required ° °[facility]   °[address]    °[place] [address]     °[phone]       °             °[facility] °[address] °[place], [place] [address] °[phone] °336-235-0363(schedule application appt.) °Application Required ° °[facility] (women only)    °[address]     °[place],  [address]     °336-884-1039      °  Intake starts 6pm daily °Need valid ID, SSC, & Police report °[facility] [place] °[address] °[place], [place] °336-881-5420 °Application Required ° °[facility] (men only)     °[address]      °[place], [place]     °[phone]      ° °[facility] °(Pregnant women only) °[address] °[place], [place] °336-275-0206 ° °[facility]      °[address]      °[place], [place] [address]     °[phone]      °       °[facility] °[address] °[place], [place] °336-723-1848 °90 day commitment/SA/Application process ° °[facility](men only)     °[address]     °[place], [place]     °[phone]       °Check-in at 7pm     °       °[facility] °[address] °[place], [place] [address] °336-248-6684 °Men/Women/Women and Children must be there by 7 pm ° °[facility] °[place],  °336-722-8721                ° °
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
## 2016-04-05 NOTE — ED Notes (Signed)
RAPID OBGYN RN HERE FOR EVALUATION. EDPA Jethro Bolus WITH RAPID OBGYN RN. NO NEED TO CONTINUE TO FETAL MONITORING.

## 2016-04-05 NOTE — ED Notes (Signed)
LAB AT BEDSIDE COLLECTING BLOOD
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
## 2016-04-06 ENCOUNTER — Encounter (HOSPITAL_COMMUNITY): Payer: Self-pay | Admitting: *Deleted

## 2016-04-06 ENCOUNTER — Inpatient Hospital Stay (HOSPITAL_COMMUNITY)
Admission: AD | Admit: 2016-04-06 | Discharge: 2016-04-08 | DRG: 767 | Disposition: A | Discharge status: Home / Self Care | Payer: Medicaid Other | Source: Ambulatory Visit | Attending: Obstetrics & Gynecology | Admitting: Obstetrics & Gynecology

## 2016-04-06 ENCOUNTER — Inpatient Hospital Stay (HOSPITAL_COMMUNITY): Payer: Medicaid Other | Admitting: Anesthesiology

## 2016-04-06 DIAGNOSIS — O99824 Streptococcus B carrier state complicating childbirth: Secondary | ICD-10-CM | POA: Diagnosis not present

## 2016-04-06 DIAGNOSIS — O99334 Smoking (tobacco) complicating childbirth: Secondary | ICD-10-CM | POA: Diagnosis not present

## 2016-04-06 DIAGNOSIS — F1210 Cannabis abuse, uncomplicated: Secondary | ICD-10-CM | POA: Diagnosis not present

## 2016-04-06 DIAGNOSIS — O99324 Drug use complicating childbirth: Secondary | ICD-10-CM | POA: Diagnosis not present

## 2016-04-06 DIAGNOSIS — Z302 Encounter for sterilization: Secondary | ICD-10-CM | POA: Diagnosis not present

## 2016-04-06 DIAGNOSIS — Z3A38 38 weeks gestation of pregnancy: Secondary | ICD-10-CM

## 2016-04-06 DIAGNOSIS — O99214 Obesity complicating childbirth: Principal | ICD-10-CM | POA: Diagnosis present

## 2016-04-06 DIAGNOSIS — O1002 Pre-existing essential hypertension complicating childbirth: Secondary | ICD-10-CM | POA: Diagnosis not present

## 2016-04-06 DIAGNOSIS — F17210 Nicotine dependence, cigarettes, uncomplicated: Secondary | ICD-10-CM | POA: Diagnosis not present

## 2016-04-06 DIAGNOSIS — Z6841 Body Mass Index (BMI) 40.0 and over, adult: Secondary | ICD-10-CM

## 2016-04-06 DIAGNOSIS — Z3493 Encounter for supervision of normal pregnancy, unspecified, third trimester: Secondary | ICD-10-CM | POA: Diagnosis present

## 2016-04-06 HISTORY — DX: Chlamydial infection, unspecified: A74.9

## 2016-04-06 LAB — TYPE AND SCREEN
ABO/RH(D): A POS
Antibody Screen: NEGATIVE

## 2016-04-06 LAB — COMPREHENSIVE METABOLIC PANEL
ALT: 15 U/L (ref 14–54)
ANION GAP: 8 (ref 5–15)
AST: 30 U/L (ref 15–41)
Albumin: 2.8 g/dL — ABNORMAL LOW (ref 3.5–5.0)
Alkaline Phosphatase: 129 U/L — ABNORMAL HIGH (ref 38–126)
BILIRUBIN TOTAL: 0.6 mg/dL (ref 0.3–1.2)
BUN: 7 mg/dL (ref 6–20)
CO2: 21 mmol/L — ABNORMAL LOW (ref 22–32)
Calcium: 8.8 mg/dL — ABNORMAL LOW (ref 8.9–10.3)
Chloride: 104 mmol/L (ref 101–111)
Creatinine, Ser: 0.61 mg/dL (ref 0.44–1.00)
GFR calc non Af Amer: >60 mL/min (ref >60)
Glucose, Bld: 66 mg/dL (ref 65–99)
POTASSIUM: 3.8 mmol/L (ref 3.5–5.1)
Sodium: 133 mmol/L — ABNORMAL LOW (ref 135–145)
TOTAL PROTEIN: 6.8 g/dL (ref 6.5–8.1)

## 2016-04-06 LAB — CBC
HCT: 33.8 % — ABNORMAL LOW (ref 36.0–46.0)
HEMOGLOBIN: 11.3 g/dL — AB (ref 12.0–15.0)
MCH: 27.2 pg (ref 26.0–34.0)
MCHC: 33.4 g/dL (ref 30.0–36.0)
MCV: 81.4 fL (ref 78.0–100.0)
PLATELETS: 121 10*3/uL — AB (ref 150–400)
RBC: 4.15 MIL/uL (ref 3.87–5.11)
RDW: 13.7 % (ref 11.5–15.5)
WBC: 8.7 10*3/uL (ref 4.0–10.5)

## 2016-04-06 LAB — PROTEIN / CREATININE RATIO, URINE
CREATININE, URINE: 59 mg/dL
Total Protein, Urine: <6 mg/dL

## 2016-04-06 LAB — RAPID URINE DRUG SCREEN, HOSP PERFORMED
Amphetamines: NOT DETECTED
Barbiturates: NOT DETECTED
Benzodiazepines: NOT DETECTED
Cocaine: NOT DETECTED
OPIATES: NOT DETECTED
TETRAHYDROCANNABINOL: POSITIVE — AB

## 2016-04-06 MED ORDER — SENNOSIDES-DOCUSATE SODIUM 8.6-50 MG PO TABS
2.0000 | ORAL_TABLET | ORAL | Status: DC
  Administered 2016-04-06: 2 via ORAL
  Filled 2016-04-06: qty 2

## 2016-04-06 MED ORDER — OXYCODONE-ACETAMINOPHEN 5-325 MG PO TABS: 2.0000 | ORAL_TABLET | ORAL | Status: DC | PRN

## 2016-04-06 MED ORDER — IBUPROFEN 600 MG PO TABS
600.0000 mg | ORAL_TABLET | Freq: Four times a day (QID) | ORAL | Status: DC
  Administered 2016-04-06 – 2016-04-07 (×2): 600 mg via ORAL
  Filled 2016-04-06 (×2): qty 1

## 2016-04-06 MED ORDER — ACETAMINOPHEN 325 MG PO TABS
650.0000 mg | ORAL_TABLET | ORAL | Status: DC | PRN
  Administered 2016-04-06 – 2016-04-07 (×2): 650 mg via ORAL
  Filled 2016-04-06 (×2): qty 2

## 2016-04-06 MED ORDER — LIDOCAINE HCL (PF) 1 % IJ SOLN
INTRAMUSCULAR | Status: DC | PRN
  Administered 2016-04-06 (×2): 4 mL

## 2016-04-06 MED ORDER — EPHEDRINE 5 MG/ML INJ
10.0000 mg | INTRAVENOUS | Status: DC | PRN
  Filled 2016-04-06: qty 2

## 2016-04-06 MED ORDER — FENTANYL CITRATE (PF) 100 MCG/2ML IJ SOLN: 100.0000 ug | INTRAMUSCULAR | Status: DC | PRN

## 2016-04-06 MED ORDER — WITCH HAZEL-GLYCERIN EX PADS: 1.0000 "application " | MEDICATED_PAD | CUTANEOUS | Status: DC | PRN

## 2016-04-06 MED ORDER — SOD CITRATE-CITRIC ACID 500-334 MG/5ML PO SOLN: 30.0000 mL | ORAL | Status: DC | PRN

## 2016-04-06 MED ORDER — DIPHENHYDRAMINE HCL 50 MG/ML IJ SOLN: 12.5000 mg | INTRAMUSCULAR | Status: DC | PRN

## 2016-04-06 MED ORDER — BENZOCAINE-MENTHOL 20-0.5 % EX AERO: 1.0000 "application " | INHALATION_SPRAY | CUTANEOUS | Status: DC | PRN

## 2016-04-06 MED ORDER — ONDANSETRON HCL 4 MG/2ML IJ SOLN: 4.0000 mg | INTRAMUSCULAR | Status: DC | PRN

## 2016-04-06 MED ORDER — LACTATED RINGERS IV SOLN
INTRAVENOUS | Status: DC
  Administered 2016-04-06: 13:00:00 via INTRAVENOUS

## 2016-04-06 MED ORDER — PENICILLIN G POTASSIUM 5000000 UNITS IJ SOLR
5.0000 10*6.[IU] | Freq: Once | INTRAVENOUS | Status: AC
  Administered 2016-04-06: 5 10*6.[IU] via INTRAVENOUS
  Filled 2016-04-06: qty 5

## 2016-04-06 MED ORDER — TETANUS-DIPHTH-ACELL PERTUSSIS 5-2.5-18.5 LF-MCG/0.5 IM SUSP: 0.5000 mL | Freq: Once | INTRAMUSCULAR | Status: DC

## 2016-04-06 MED ORDER — PHENYLEPHRINE 40 MCG/ML (10ML) SYRINGE FOR IV PUSH (FOR BLOOD PRESSURE SUPPORT)
80.0000 ug | PREFILLED_SYRINGE | INTRAVENOUS | Status: DC | PRN
  Filled 2016-04-06: qty 5
  Filled 2016-04-06: qty 10

## 2016-04-06 MED ORDER — ONDANSETRON HCL 4 MG PO TABS
4.0000 mg | ORAL_TABLET | ORAL | Status: DC | PRN
Start: 2016-04-06 — End: 2016-04-07

## 2016-04-06 MED ORDER — ZOLPIDEM TARTRATE 5 MG PO TABS: 5.0000 mg | ORAL_TABLET | Freq: Every evening | ORAL | Status: DC | PRN

## 2016-04-06 MED ORDER — DIPHENHYDRAMINE HCL 25 MG PO CAPS: 25.0000 mg | ORAL_CAPSULE | Freq: Four times a day (QID) | ORAL | Status: DC | PRN

## 2016-04-06 MED ORDER — LACTATED RINGERS IV SOLN: 500.0000 mL | Freq: Once | INTRAVENOUS | Status: AC

## 2016-04-06 MED ORDER — DIBUCAINE 1 % RE OINT: 1.0000 "application " | TOPICAL_OINTMENT | RECTAL | Status: DC | PRN

## 2016-04-06 MED ORDER — COCONUT OIL OIL: 1.0000 "application " | TOPICAL_OIL | Status: DC | PRN

## 2016-04-06 MED ORDER — LIDOCAINE HCL (PF) 1 % IJ SOLN
30.0000 mL | INTRAMUSCULAR | Status: DC | PRN
  Filled 2016-04-06: qty 30

## 2016-04-06 MED ORDER — PHENYLEPHRINE 40 MCG/ML (10ML) SYRINGE FOR IV PUSH (FOR BLOOD PRESSURE SUPPORT)
80.0000 ug | PREFILLED_SYRINGE | INTRAVENOUS | Status: DC | PRN
  Filled 2016-04-06: qty 5

## 2016-04-06 MED ORDER — ACETAMINOPHEN 325 MG PO TABS: 650.0000 mg | ORAL_TABLET | ORAL | Status: DC | PRN

## 2016-04-06 MED ORDER — OXYTOCIN 40 UNITS IN LACTATED RINGERS INFUSION - SIMPLE MED
2.5000 [IU]/h | INTRAVENOUS | Status: DC
  Filled 2016-04-06: qty 1000

## 2016-04-06 MED ORDER — OXYTOCIN BOLUS FROM INFUSION
500.0000 mL | Freq: Once | INTRAVENOUS | Status: AC
  Administered 2016-04-06: 500 mL via INTRAVENOUS

## 2016-04-06 MED ORDER — LACTATED RINGERS IV SOLN
500.0000 mL | INTRAVENOUS | Status: DC | PRN
  Administered 2016-04-06: 500 mL via INTRAVENOUS

## 2016-04-06 MED ORDER — PRENATAL MULTIVITAMIN CH: 1.0000 | ORAL_TABLET | Freq: Every day | ORAL | Status: DC

## 2016-04-06 MED ORDER — PENICILLIN G POT IN DEXTROSE 60000 UNIT/ML IV SOLN
3.0000 10*6.[IU] | INTRAVENOUS | Status: DC
  Administered 2016-04-06: 3 10*6.[IU] via INTRAVENOUS
  Filled 2016-04-06 (×3): qty 50

## 2016-04-06 MED ORDER — OXYCODONE-ACETAMINOPHEN 5-325 MG PO TABS: 1.0000 | ORAL_TABLET | ORAL | Status: DC | PRN

## 2016-04-06 MED ORDER — SIMETHICONE 80 MG PO CHEW: 80.0000 mg | CHEWABLE_TABLET | ORAL | Status: DC | PRN

## 2016-04-06 MED ORDER — ONDANSETRON HCL 4 MG/2ML IJ SOLN
4.0000 mg | Freq: Four times a day (QID) | INTRAMUSCULAR | Status: DC | PRN
Start: 2016-04-06 — End: 2016-04-06

## 2016-04-06 MED ORDER — FENTANYL 2.5 MCG/ML BUPIVACAINE 1/10 % EPIDURAL INFUSION (WH - ANES)
14.0000 mL/h | INTRAMUSCULAR | Status: DC | PRN
  Administered 2016-04-06 (×2): 14 mL/h via EPIDURAL
  Filled 2016-04-06: qty 100

## 2016-04-06 MED ORDER — FENTANYL 2.5 MCG/ML BUPIVACAINE 1/10 % EPIDURAL INFUSION (WH - ANES): 14.0000 mL/h | INTRAMUSCULAR | Status: DC | PRN

## 2016-04-06 NOTE — H&P (Signed)
LABOR AND DELIVERY ADMISSION HISTORY AND PHYSICAL NOTE  Lisa Hunt is a 36 y.o. female [id number] with IUP at [redacted]w[redacted]d by [place] presenting for IOL.   She reports positive fetal movement. She denies leakage of fluid or vaginal bleeding.  Prenatal History/Complications:  Past Medical History: Past Medical History:  Diagnosis Date  . Chlamydia   . Hypertension     Past Surgical History: Past Surgical History:  Procedure Laterality Date  . WISDOM TOOTH EXTRACTION      Obstetrical History: OB History    Gravida Para Term Preterm AB Living   SAB TAB Ectopic Multiple Live Births   2 3            Social History: Social History   Social History  . Marital status: Divorced    Spouse name: N/A  . Number of children: N/A  . Years of education: N/A   Social History Main Topics  . Smoking status: Current Every Day Smoker    Packs/day: 0.25    Types: Cigarettes  . Smokeless tobacco: Current User  . Alcohol use No  . Drug use: Yes    Types: Marijuana  . Sexual activity: Yes    Birth control/ protection: None   Other Topics Concern  . None   Social History Narrative  . None    Family History: History reviewed. No pertinent family history.  Allergies: No Known Allergies  Prescriptions Prior to Admission  Medication Sig Dispense Refill Last Dose  . acetaminophen (TYLENOL) 500 MG tablet Take 500 mg by mouth every 6 (six) hours as needed for mild pain.   04/04/2016 at Unknown time  . cephALEXin (KEFLEX) 500 MG capsule Take 1 capsule (500 mg total) by mouth 4 (four) times daily. (Patient not taking: Reported on 04/05/2016) 28 capsule 0 Not Taking at Unknown time  . clotrimazole (GYNE-LOTRIMIN) 1 % vaginal cream Place 1 Applicatorful vaginally at bedtime.   02/17/2016 at Unknown time  . metroNIDAZOLE (FLAGYL) 500 MG tablet Take 1 tablet (500 mg total) by mouth 2 (two) times daily. (Patient not taking: Reported on 04/05/2016) 14 tablet 0 Not Taking at Unknown time   . Prenatal Vit-Fe Phos-FA-Omega (VITAFOL GUMMIES) 3.33-0.333-34.8 MG CHEW CHEW 3 GUMMIES EACH DAY  5 04/04/2016 at Unknown time     Review of Systems   All systems reviewed and negative except as stated in HPI  Blood pressure 123/87, pulse 79, temperature 98.2 F (36.8 C), temperature source Oral, resp. rate 18, last menstrual period 07/11/2015. General appearance: alert, cooperative and appears stated age Lungs: normal effort, no audible wheezing Heart: regular rate and pulses palpable Abdomen: soft, non-tender Extremities: No calf swelling or tenderness Presentation: cephalic by nursing exam Fetal monitoring: FHR 140, no decel, moderate variability Uterine activity: mild contractions Dilation: 5 Effacement (%): 80 Station: -2 Exam by:: Edger House, RN   Prenatal labs: ABO, Rh: --/--/A POS (04/02 1245) Antibody: PENDING (04/02 1245) Rubella: immune RPR:  Negative HBsAg:  Negative  HIV:  Non Reactive GBS: Positive (03/09 0000)  1 hr Glucola: 98 Genetic screening:  N/A too late  Anatomy US: 02/18/2016  Prenatal Transfer Tool  Maternal Diabetes: No Genetic Screening: Declined Maternal Ultrasounds/Referrals: Abnormal:  Findings:   Other: polyhydramnios resolved  Fetal Ultrasounds or other Referrals:  Other:  Maternal Substance Abuse:  Yes:  Type: Smoker, Marijuana Significant Maternal Medications:  Meds include: Other:  Significant Maternal Lab Results: Lab values include: Group B Strep positive, chlamydia  resolved   Results for orders placed or performed during the hospital encounter of 04/06/16 (from the past 24 hour(s))  CBC   Collection Time: 04/06/16 12:45 PM  Result Value Ref Range   WBC 8.7 4.0 - 10.5 K/uL   RBC 4.15 3.87 - 5.11 MIL/uL   Hemoglobin 11.3 (L) 12.0 - 15.0 g/dL   HCT 40.9 (L) 81.1 - 91.4 %   MCV 81.4 78.0 - 100.0 fL   MCH 27.2 26.0 - 34.0 pg   MCHC 33.4 30.0 - 36.0 g/dL   RDW 78.2 95.6 - 21.3 %   Platelets 121 (L) 150 - 400 K/uL  Type and  screen [facility]   Collection Time: 04/06/16 12:45 PM  Result Value Ref Range   ABO/RH(D) A POS    Antibody Screen PENDING    Sample Expiration 04/09/2016     Patient Active Problem List   Diagnosis Date Noted  . Normal labor 04/06/2016    Assessment: Lisa Hunt is a 36 y.o. [id number] at [redacted]w[redacted]d here for SOL.  #Labor: expectant management, AROM when comfortable #Pain: Epidural #FWB: Category 1 #ID:  PCN #MOF: Breast/Bottle #MOC: Tubal ligation #Circ:  N/A  Abdoulaye Diallo 04/06/2016, 1:51 PM  OB FELLOW HISTORY AND PHYSICAL ATTESTATION  I confirm that I have verified the information documented in the resident's note and that I have also personally reperformed the physical exam and all medical decision making activities.      Ernestina Penna 04/06/2016, 3:23 PM

## 2016-04-06 NOTE — MAU Note (Signed)
Pt C/O uc's every 13-15 minutes apart since this morning, denies bleeding or LOF.

## 2016-04-06 NOTE — Anesthesia Pain Management Evaluation Note (Signed)
  CRNA Pain Management Visit Note  Patient: Lisa Hunt, 36 y.o., female  "Hello I am a member of the anesthesia team at [facility]. We have an anesthesia team available at all times to provide care throughout the hospital, including epidural management and anesthesia for C-section. I don't know your plan for the delivery whether it a natural birth, water birth, IV sedation, nitrous supplementation, doula or epidural, but we want to meet your pain goals."   1.Was your pain managed to your expectations on prior hospitalizations?   Yes   2.What is your expectation for pain management during this hospitalization?     Epidural  3.How can we help you reach that goal? epidural  Record the patient's initial score and the patient's pain goal.   Pain: 7  Pain Goal: 5 [facility] wants you to be able to say your pain was always managed very well.  Lisa Hunt 04/06/2016

## 2016-04-06 NOTE — Anesthesia Preprocedure Evaluation (Signed)
Anesthesia Evaluation  Patient identified by MRN, date of birth, ID band Patient awake    Reviewed: Allergy & Precautions, NPO status , Patient's Chart, lab work & pertinent test results  Airway Mallampati: II  TM Distance: >3 FB Neck ROM: Full    Dental no notable dental hx.    Pulmonary neg pulmonary ROS, Current Smoker,    Pulmonary exam normal breath sounds clear to auscultation       Cardiovascular hypertension, Pt. on medications negative cardio ROS Normal cardiovascular exam Rhythm:Regular Rate:Normal     Neuro/Psych negative neurological ROS  negative psych ROS   GI/Hepatic negative GI ROS, Neg liver ROS,   Endo/Other  Morbid obesity  Renal/GU negative Renal ROS     Musculoskeletal negative musculoskeletal ROS (+)   Abdominal   Peds  Hematology negative hematology ROS (+)   Anesthesia Other Findings   Reproductive/Obstetrics (+) Pregnancy                             Anesthesia Physical Anesthesia Plan  ASA: III  Anesthesia Plan: Epidural   Post-op Pain Management:    Induction:   Airway Management Planned:   Additional Equipment:   Intra-op Plan:   Post-operative Plan:   Informed Consent: I have reviewed the patients History and Physical, chart, labs and discussed the procedure including the risks, benefits and alternatives for the proposed anesthesia with the patient or authorized representative who has indicated his/her understanding and acceptance.     Plan Discussed with:   Anesthesia Plan Comments:         Anesthesia Quick Evaluation

## 2016-04-06 NOTE — Anesthesia Procedure Notes (Signed)
Epidural Patient location during procedure: OB  Staffing Anesthesiologist: Lewie Loron Performed: anesthesiologist   Preanesthetic Checklist Completed: patient identified, pre-op evaluation, timeout performed, IV checked, risks and benefits discussed and monitors and equipment checked  Epidural Patient position: sitting Prep: site prepped and draped and DuraPrep Patient monitoring: heart rate, continuous pulse ox and blood pressure Approach: midline Location: L3-L4 Injection technique: LOR air and LOR saline  Needle:  Needle type: Tuohy  Needle gauge: 17 G Needle length: 9 cm Needle insertion depth: 6 cm Catheter type: closed end flexible Catheter size: 19 Gauge Catheter at skin depth: 12 cm Test dose: negative  Assessment Sensory level: T8 Events: blood not aspirated, injection not painful, no injection resistance, negative IV test and no paresthesia  Additional Notes Reason for block:procedure for pain

## 2016-04-06 NOTE — MAU Note (Signed)
Urine in lab 

## 2016-04-07 ENCOUNTER — Encounter (HOSPITAL_COMMUNITY): Payer: Self-pay

## 2016-04-07 ENCOUNTER — Encounter (HOSPITAL_COMMUNITY)
Admission: AD | Disposition: A | Discharge status: Home / Self Care | Payer: Self-pay | Source: Ambulatory Visit | Attending: Obstetrics & Gynecology

## 2016-04-07 ENCOUNTER — Inpatient Hospital Stay (HOSPITAL_COMMUNITY): Payer: Medicaid Other | Admitting: Anesthesiology

## 2016-04-07 DIAGNOSIS — Z3A38 38 weeks gestation of pregnancy: Secondary | ICD-10-CM

## 2016-04-07 DIAGNOSIS — O99824 Streptococcus B carrier state complicating childbirth: Secondary | ICD-10-CM

## 2016-04-07 DIAGNOSIS — Z302 Encounter for sterilization: Secondary | ICD-10-CM

## 2016-04-07 HISTORY — PX: TUBAL LIGATION: SHX77

## 2016-04-07 LAB — RPR: RPR Ser Ql: NONREACTIVE

## 2016-04-07 SURGERY — LIGATION, FALLOPIAN TUBE, POSTPARTUM
Anesthesia: Epidural | Site: Abdomen | Laterality: Bilateral

## 2016-04-07 MED ORDER — COCONUT OIL OIL: 1.0000 "application " | TOPICAL_OIL | Status: DC | PRN

## 2016-04-07 MED ORDER — DIPHENHYDRAMINE HCL 25 MG PO CAPS: 25.0000 mg | ORAL_CAPSULE | ORAL | Status: DC | PRN

## 2016-04-07 MED ORDER — FAMOTIDINE 20 MG PO TABS
40.0000 mg | ORAL_TABLET | Freq: Once | ORAL | Status: AC
  Administered 2016-04-07: 40 mg via ORAL
  Filled 2016-04-07: qty 2

## 2016-04-07 MED ORDER — METOCLOPRAMIDE HCL 10 MG PO TABS
10.0000 mg | ORAL_TABLET | Freq: Once | ORAL | Status: AC
  Administered 2016-04-07: 10 mg via ORAL
  Filled 2016-04-07: qty 1

## 2016-04-07 MED ORDER — OXYCODONE HCL 5 MG/5ML PO SOLN: 5.0000 mg | Freq: Once | ORAL | Status: DC | PRN

## 2016-04-07 MED ORDER — WITCH HAZEL-GLYCERIN EX PADS: 1.0000 "application " | MEDICATED_PAD | CUTANEOUS | Status: DC | PRN

## 2016-04-07 MED ORDER — PROMETHAZINE HCL 25 MG/ML IJ SOLN: 6.2500 mg | INTRAMUSCULAR | Status: DC | PRN

## 2016-04-07 MED ORDER — MIDAZOLAM HCL 2 MG/2ML IJ SOLN
INTRAMUSCULAR | Status: DC | PRN
  Administered 2016-04-07 (×2): 1 mg via INTRAVENOUS

## 2016-04-07 MED ORDER — NALBUPHINE HCL 10 MG/ML IJ SOLN: 5.0000 mg | Freq: Once | INTRAMUSCULAR | Status: DC | PRN

## 2016-04-07 MED ORDER — DIPHENHYDRAMINE HCL 50 MG/ML IJ SOLN: 12.5000 mg | INTRAMUSCULAR | Status: DC | PRN

## 2016-04-07 MED ORDER — FENTANYL CITRATE (PF) 100 MCG/2ML IJ SOLN
INTRAMUSCULAR | Status: AC
  Filled 2016-04-07: qty 2

## 2016-04-07 MED ORDER — SODIUM CHLORIDE 0.9% FLUSH: 3.0000 mL | INTRAVENOUS | Status: DC | PRN

## 2016-04-07 MED ORDER — BUPIVACAINE HCL (PF) 0.25 % IJ SOLN
INTRAMUSCULAR | Status: DC | PRN
  Administered 2016-04-07: 7 mL

## 2016-04-07 MED ORDER — PNEUMOCOCCAL VAC POLYVALENT 25 MCG/0.5ML IJ INJ
0.5000 mL | INJECTION | INTRAMUSCULAR | Status: DC
  Filled 2016-04-07: qty 0.5

## 2016-04-07 MED ORDER — OXYCODONE-ACETAMINOPHEN 5-325 MG PO TABS: 1.0000 | ORAL_TABLET | Freq: Four times a day (QID) | ORAL | Status: DC | PRN

## 2016-04-07 MED ORDER — ZOLPIDEM TARTRATE 5 MG PO TABS: 5.0000 mg | ORAL_TABLET | Freq: Every evening | ORAL | Status: DC | PRN

## 2016-04-07 MED ORDER — KETOROLAC TROMETHAMINE 30 MG/ML IJ SOLN: 30.0000 mg | Freq: Four times a day (QID) | INTRAMUSCULAR | Status: DC | PRN

## 2016-04-07 MED ORDER — NALOXONE HCL 0.4 MG/ML IJ SOLN: 0.4000 mg | INTRAMUSCULAR | Status: DC | PRN

## 2016-04-07 MED ORDER — LIDOCAINE-EPINEPHRINE (PF) 2 %-1:200000 IJ SOLN
INTRAMUSCULAR | Status: AC
  Filled 2016-04-07: qty 20

## 2016-04-07 MED ORDER — LIDOCAINE-EPINEPHRINE (PF) 2 %-1:200000 IJ SOLN
INTRAMUSCULAR | Status: DC | PRN
  Administered 2016-04-07: 5 mL via EPIDURAL
  Administered 2016-04-07: 3 mL via EPIDURAL
  Administered 2016-04-07: 2 mL via EPIDURAL
  Administered 2016-04-07 (×3): 5 mL via EPIDURAL

## 2016-04-07 MED ORDER — MEASLES, MUMPS & RUBELLA VAC ~~LOC~~ INJ
0.5000 mL | INJECTION | Freq: Once | SUBCUTANEOUS | Status: DC
  Filled 2016-04-07: qty 0.5

## 2016-04-07 MED ORDER — SIMETHICONE 80 MG PO CHEW: 80.0000 mg | CHEWABLE_TABLET | ORAL | Status: DC | PRN

## 2016-04-07 MED ORDER — ONDANSETRON HCL 4 MG PO TABS: 4.0000 mg | ORAL_TABLET | ORAL | Status: DC | PRN

## 2016-04-07 MED ORDER — IBUPROFEN 600 MG PO TABS
600.0000 mg | ORAL_TABLET | Freq: Four times a day (QID) | ORAL | Status: DC
  Administered 2016-04-07 – 2016-04-08 (×4): 600 mg via ORAL
  Filled 2016-04-07 (×4): qty 1

## 2016-04-07 MED ORDER — DIBUCAINE 1 % RE OINT: 1.0000 "application " | TOPICAL_OINTMENT | RECTAL | Status: DC | PRN

## 2016-04-07 MED ORDER — OXYCODONE HCL 5 MG PO TABS: 5.0000 mg | ORAL_TABLET | Freq: Once | ORAL | Status: DC | PRN

## 2016-04-07 MED ORDER — AMLODIPINE BESYLATE 5 MG PO TABS
5.0000 mg | ORAL_TABLET | Freq: Every day | ORAL | Status: DC
  Administered 2016-04-07: 5 mg via ORAL
  Filled 2016-04-07 (×2): qty 1

## 2016-04-07 MED ORDER — CHLOROPROCAINE HCL (PF) 3 % IJ SOLN
INTRAMUSCULAR | Status: AC
  Filled 2016-04-07: qty 20

## 2016-04-07 MED ORDER — FENTANYL CITRATE (PF) 100 MCG/2ML IJ SOLN
INTRAMUSCULAR | Status: DC | PRN
  Administered 2016-04-07 (×2): 50 ug via INTRAVENOUS

## 2016-04-07 MED ORDER — NALBUPHINE HCL 10 MG/ML IJ SOLN: 5.0000 mg | INTRAMUSCULAR | Status: DC | PRN

## 2016-04-07 MED ORDER — BENZOCAINE-MENTHOL 20-0.5 % EX AERO: 1.0000 "application " | INHALATION_SPRAY | CUTANEOUS | Status: DC | PRN

## 2016-04-07 MED ORDER — PRENATAL MULTIVITAMIN CH
1.0000 | ORAL_TABLET | Freq: Every day | ORAL | Status: DC
  Filled 2016-04-07: qty 1

## 2016-04-07 MED ORDER — HYDROMORPHONE HCL 1 MG/ML IJ SOLN: 0.2500 mg | INTRAMUSCULAR | Status: DC | PRN

## 2016-04-07 MED ORDER — SODIUM BICARBONATE 8.4 % IV SOLN
INTRAVENOUS | Status: AC
  Filled 2016-04-07: qty 50

## 2016-04-07 MED ORDER — SENNOSIDES-DOCUSATE SODIUM 8.6-50 MG PO TABS
2.0000 | ORAL_TABLET | ORAL | Status: DC
  Administered 2016-04-07: 2 via ORAL
  Filled 2016-04-07: qty 2

## 2016-04-07 MED ORDER — OXYCODONE HCL 5 MG PO TABS
5.0000 mg | ORAL_TABLET | Freq: Once | ORAL | Status: AC
  Administered 2016-04-07: 10 mg via ORAL
  Filled 2016-04-07: qty 2

## 2016-04-07 MED ORDER — BUPIVACAINE HCL (PF) 0.25 % IJ SOLN
INTRAMUSCULAR | Status: AC
  Filled 2016-04-07: qty 30

## 2016-04-07 MED ORDER — ACETAMINOPHEN 325 MG PO TABS
650.0000 mg | ORAL_TABLET | ORAL | Status: DC | PRN
  Administered 2016-04-07 – 2016-04-08 (×4): 650 mg via ORAL
  Filled 2016-04-07 (×4): qty 2

## 2016-04-07 MED ORDER — ONDANSETRON HCL 4 MG/2ML IJ SOLN: 4.0000 mg | Freq: Three times a day (TID) | INTRAMUSCULAR | Status: DC | PRN

## 2016-04-07 MED ORDER — NALOXONE HCL 2 MG/2ML IJ SOSY
1.0000 ug/kg/h | PREFILLED_SYRINGE | INTRAMUSCULAR | Status: DC | PRN
  Filled 2016-04-07: qty 2

## 2016-04-07 MED ORDER — LACTATED RINGERS IV SOLN
INTRAVENOUS | Status: DC
  Administered 2016-04-07: 10 mL/h via INTRAVENOUS
  Administered 2016-04-07: 10:00:00 via INTRAVENOUS

## 2016-04-07 MED ORDER — MIDAZOLAM HCL 2 MG/2ML IJ SOLN
INTRAMUSCULAR | Status: AC
  Filled 2016-04-07: qty 2

## 2016-04-07 MED ORDER — DIPHENHYDRAMINE HCL 25 MG PO CAPS: 25.0000 mg | ORAL_CAPSULE | Freq: Four times a day (QID) | ORAL | Status: DC | PRN

## 2016-04-07 MED ORDER — ONDANSETRON HCL 4 MG/2ML IJ SOLN: 4.0000 mg | INTRAMUSCULAR | Status: DC | PRN

## 2016-04-07 MED ORDER — MEPERIDINE HCL 25 MG/ML IJ SOLN: 6.2500 mg | INTRAMUSCULAR | Status: DC | PRN

## 2016-04-07 MED ORDER — SCOPOLAMINE 1 MG/3DAYS TD PT72
1.0000 | MEDICATED_PATCH | Freq: Once | TRANSDERMAL | Status: DC
  Filled 2016-04-07: qty 1

## 2016-04-07 MED ORDER — TETANUS-DIPHTH-ACELL PERTUSSIS 5-2.5-18.5 LF-MCG/0.5 IM SUSP: 0.5000 mL | Freq: Once | INTRAMUSCULAR | Status: DC

## 2016-04-07 SURGICAL SUPPLY — 24 items
ADH SKN CLS APL DERMABOND .7 (GAUZE/BANDAGES/DRESSINGS) ×2
BLADE SURG 11 STRL SS (BLADE) ×3 IMPLANT
CLIP FILSHIE TUBAL LIGA STRL (Clip) ×3 IMPLANT
CLOTH BEACON ORANGE TIMEOUT ST (SAFETY) ×3 IMPLANT
DERMABOND ADVANCED (GAUZE/BANDAGES/DRESSINGS) ×4
DERMABOND ADVANCED .7 DNX12 (GAUZE/BANDAGES/DRESSINGS) IMPLANT
DRSG OPSITE POSTOP 3X4 (GAUZE/BANDAGES/DRESSINGS) ×3 IMPLANT
DURAPREP 26ML APPLICATOR (WOUND CARE) ×3 IMPLANT
GLOVE BIOGEL PI IND STRL 7.0 (GLOVE) ×1 IMPLANT
GLOVE BIOGEL PI IND STRL 7.5 (GLOVE) ×1 IMPLANT
GLOVE BIOGEL PI INDICATOR 7.0 (GLOVE) ×2
GLOVE BIOGEL PI INDICATOR 7.5 (GLOVE) ×2
GLOVE ECLIPSE 7.5 STRL STRAW (GLOVE) ×3 IMPLANT
GOWN STRL REUS W/TWL LRG LVL3 (GOWN DISPOSABLE) ×6 IMPLANT
NEEDLE HYPO 22GX1.5 SAFETY (NEEDLE) ×3 IMPLANT
NS IRRIG 1000ML POUR BTL (IV SOLUTION) ×3 IMPLANT
PACK ABDOMINAL MINOR (CUSTOM PROCEDURE TRAY) ×3 IMPLANT
PROTECTOR NERVE ULNAR (MISCELLANEOUS) ×3 IMPLANT
SPONGE LAP 4X18 X RAY DECT (DISPOSABLE) IMPLANT
SUT VICRYL 0 UR6 27IN ABS (SUTURE) ×3 IMPLANT
SUT VICRYL 4-0 PS2 18IN ABS (SUTURE) ×3 IMPLANT
SYR CONTROL 10ML LL (SYRINGE) ×3 IMPLANT
TOWEL OR 17X24 6PK STRL BLUE (TOWEL DISPOSABLE) ×6 IMPLANT
TRAY FOLEY CATH SILVER 14FR (SET/KITS/TRAYS/PACK) ×3 IMPLANT

## 2016-04-07 NOTE — Plan of Care (Signed)
Problem: Activity: Goal: Ability to tolerate increased activity will improve Outcome: Completed/Met Date Met: 04/07/16 Patient ambulating well this AM and after BTL this evening.

## 2016-04-07 NOTE — Anesthesia Preprocedure Evaluation (Signed)
Anesthesia Evaluation  Patient identified by MRN, date of birth, ID band Patient awake    Reviewed: Allergy & Precautions, NPO status , Patient's Chart, lab work & pertinent test results  Airway Mallampati: II  TM Distance: >3 FB Neck ROM: Full    Dental no notable dental hx.    Pulmonary neg pulmonary ROS, Current Smoker,    Pulmonary exam normal breath sounds clear to auscultation       Cardiovascular hypertension, Pt. on medications negative cardio ROS Normal cardiovascular exam Rhythm:Regular Rate:Normal     Neuro/Psych negative neurological ROS  negative psych ROS   GI/Hepatic negative GI ROS, Neg liver ROS,   Endo/Other  Morbid obesity  Renal/GU negative Renal ROS     Musculoskeletal negative musculoskeletal ROS (+)   Abdominal   Peds  Hematology negative hematology ROS (+)   Anesthesia Other Findings   Reproductive/Obstetrics                             Anesthesia Physical  Anesthesia Plan  ASA: III  Anesthesia Plan: Epidural   Post-op Pain Management:    Induction:   Airway Management Planned:   Additional Equipment:   Intra-op Plan:   Post-operative Plan:   Informed Consent: I have reviewed the patients History and Physical, chart, labs and discussed the procedure including the risks, benefits and alternatives for the proposed anesthesia with the patient or authorized representative who has indicated his/her understanding and acceptance.   Dental advisory given  Plan Discussed with: CRNA  Anesthesia Plan Comments:         Anesthesia Quick Evaluation

## 2016-04-07 NOTE — Anesthesia Procedure Notes (Signed)
Procedure Name: MAC Performed by: Charlynn Salih M Pre-anesthesia Checklist: Patient identified, Emergency Drugs available, Suction available, Patient being monitored and Timeout performed Oxygen Delivery Method: Nasal cannula

## 2016-04-07 NOTE — Progress Notes (Signed)
Patient ID: Lisa Hunt, female   DOB: [birth year], 36 y.o.   MRN: [id number]  Risks of procedure discussed with patient including but not limited to: risk of regret, permanence of method, bleeding, infection, injury to surrounding organs and need for additional procedures.  Failure risk of 1 -2 % with increased risk of ectopic gestation if pregnancy occurs was also discussed with patient.    Levie Heritage, DO 04/07/2016 11:11 AM

## 2016-04-07 NOTE — Anesthesia Postprocedure Evaluation (Signed)
Anesthesia Post Note  Patient: Lisa Hunt  Procedure(s) Performed: * No procedures listed *  Patient location during evaluation: Mother Baby Anesthesia Type: Epidural Level of consciousness: awake, awake and alert, oriented and patient cooperative Pain management: pain level controlled Vital Signs Assessment: post-procedure vital signs reviewed and stable Respiratory status: spontaneous breathing, nonlabored ventilation and respiratory function stable Cardiovascular status: stable Postop Assessment: no headache, no backache, patient able to bend at knees and no signs of nausea or vomiting Anesthetic complications: no        Last Vitals:  Vitals:   04/06/16 2235 04/07/16 0525  BP: 130/78 130/90  Pulse: 68 60  Resp: 16 18  Temp: 36.6 C 36.8 C    Last Pain:  Vitals:   04/07/16 0525  TempSrc: Oral  PainSc: 3    Pain Goal: Patients Stated Pain Goal: 0 (04/06/16 1156)               Jovee Dettinger L

## 2016-04-07 NOTE — Progress Notes (Signed)
Post Partum Day #1 Subjective: no complaints, up ad lib, voiding, tolerating PO and reports normal lochia, excited about her BTL  Objective: Blood pressure 130/90, pulse 60, temperature 98.2 F (36.8 C), temperature source Oral, resp. rate 18, height  (1.676 m), weight 115.7 kg (255 lb), last menstrual period 07/11/2015, SpO2 100 %.  Physical Exam:  General: alert Lochia: appropriate Uterine Fundus: firm and NT at U DVT Evaluation: No evidence of DVT seen on physical exam.   Recent Labs  04/05/16 1150 04/06/16 1245  HGB 11.6* 11.3*  HCT 35.0* 33.8*    Assessment/Plan: Plan for discharge tomorrow after BTL today.   LOS: 1 day   Allie Bossier 04/07/2016, 6:19 AM

## 2016-04-07 NOTE — Transfer of Care (Signed)
Immediate Anesthesia Transfer of Care Note  Patient: Lisa Hunt  Procedure(s) Performed: Procedure(s): POST PARTUM TUBAL LIGATION (Bilateral)  Patient Location: PACU  Anesthesia Type:Epidural  Level of Consciousness: awake, alert  and oriented  Airway & Oxygen Therapy: Patient Spontanous Breathing  Post-op Assessment: Report given to RN and Post -op Vital signs reviewed and stable  Post vital signs: Reviewed and stable  Last Vitals:  Vitals:   04/07/16 0525 04/07/16 1018  BP: 130/90 (!) 130/92  Pulse: 60 67  Resp: 18 18  Temp: 36.8 C 36.8 C    Last Pain:  Vitals:   04/07/16 1018  TempSrc: Oral  PainSc:       Patients Stated Pain Goal: 0 (04/06/16 1156)  Complications: No apparent anesthesia complications

## 2016-04-07 NOTE — Progress Notes (Signed)
CSW attempted to meet with MOB at bedside to complete assessment for consult regarding hx of substance use. Upon this writer's arrival, MOB was in tubal procedure. CSW will attempt to complete assessment at a later time.   Omar Orrego, MSW, LCSW-A Clinical Social Worker  [facility]  Office: [phone]

## 2016-04-07 NOTE — Anesthesia Procedure Notes (Signed)
Procedure Name: MAC Performed by: Chance Karam M Pre-anesthesia Checklist: Patient identified, Emergency Drugs available, Suction available, Patient being monitored and Timeout performed Patient Re-evaluated:Patient Re-evaluated prior to inductionOxygen Delivery Method: Nasal cannula

## 2016-04-07 NOTE — Progress Notes (Signed)
Post Partum Day #1 Subjective: up ad lib, voiding and NPO for BTL.   Objective: Blood pressure 130/90, pulse 60, temperature 98.2 F (36.8 C), temperature source Oral, resp. rate 18, height  (1.676 m), weight 255 lb (115.7 kg), last menstrual period 07/11/2015, SpO2 100 %.  Physical Exam:  General: alert, cooperative and no distress Lochia: appropriate Uterine Fundus: firm Incision: none DVT Evaluation: No evidence of DVT seen on physical exam. No cords or calf tenderness. No significant calf/ankle edema.   Recent Labs  04/05/16 1150 04/06/16 1245  HGB 11.6* 11.3*  HCT 35.0* 33.8*    Assessment/Plan: Breastfeeding and Contraception BTL planned Consent on chart 02/28/16.  SW consult ordered for limited [facility] and +UDS for THC.  Elevated blood pressures noted.  Norvasc 5 mg ordered.    LOS: 1 day   Roe Coombs, CNM 04/07/2016, 8:01 AM

## 2016-04-07 NOTE — Op Note (Signed)
Lisa Hunt 04/06/2016 - 04/07/2016  PREOPERATIVE DIAGNOSIS:  Undesired fertility  POSTOPERATIVE DIAGNOSIS:  Undesired fertility  PROCEDURE:  Postpartum Bilateral Tubal Sterilization using Filshie Clips   SURGEON:  Dr Candelaria Celeste  ANESTHESIA:  Epidural  COMPLICATIONS:  None immediate.  ESTIMATED BLOOD LOSS:  Less than 20cc.  FLUIDS: 700 cc LR.  URINE OUTPUT:  Unmeasured amount of clear urine.  INDICATIONS: 35 y.o. yo [id number]  with undesired fertility,status post vaginal delivery, desires permanent sterilization. Risks and benefits of procedure discussed with patient including permanence of method, bleeding, infection, injury to surrounding organs and need for additional procedures. Risk failure of 0.5-1% with increased risk of ectopic gestation if pregnancy occurs was also discussed with patient.   FINDINGS:  Normal uterus, tubes, and ovaries.  TECHNIQUE:  The patient was taken to the operating room where her epidural anesthesia was dosed up to surgical level and found to be adequate.  She was then placed in the dorsal supine position and prepped and draped in sterile fashion.  After an adequate timeout was performed, attention was turned to the patient's abdomen where a small transverse skin incision was made under the umbilical fold. The incision was taken down to the layer of fascia using the scalpel, and fascia was incised, and extended bilaterally using Mayo scissors. The peritoneum was entered in a sharp fashion. Attention was then turned to the patient's uterus, and left fallopian tube was identified and followed out to the fimbriated end.  A Filshie clip was placed on the left fallopian tube about 2 cm from the cornual attachment, with care given to incorporate the underlying mesosalpinx.  A similar process was carried out on the rightl side allowing for bilateral tubal sterilization.  Good hemostasis was noted overall.  Local analgesia was drizzled on both operative sites.The  instruments were then removed from the patient's abdomen and the fascial incision was repaired with 0 Vicryl, and the skin was closed with a 3-0 Monocryl subcuticular stitch. The patient tolerated the procedure well.  Sponge, lap, and needle counts were correct times two.  The patient was then taken to the recovery room awake, extubated and in stable condition.   Levie Heritage, DO 04/07/2016 1:33 PM

## 2016-04-07 NOTE — OR Nursing (Signed)
PIV #[id number] patent in right hand with LR. Epidural taped and intact and saline locked. Denies pain. Permit signed.  Peripad dry, pt. Just changed prior to arrival. Matilde Bash, RN

## 2016-04-08 ENCOUNTER — Encounter (HOSPITAL_COMMUNITY): Payer: Self-pay | Admitting: Family Medicine

## 2016-04-08 DIAGNOSIS — O99824 Streptococcus B carrier state complicating childbirth: Secondary | ICD-10-CM

## 2016-04-08 DIAGNOSIS — Z3A38 38 weeks gestation of pregnancy: Secondary | ICD-10-CM

## 2016-04-08 MED ORDER — OXYCODONE-ACETAMINOPHEN 5-325 MG PO TABS: 1.0000 | ORAL_TABLET | Freq: Four times a day (QID) | ORAL | 0 refills | Status: DC | PRN

## 2016-04-08 MED ORDER — IBUPROFEN 600 MG PO TABS: 600.0000 mg | ORAL_TABLET | Freq: Four times a day (QID) | ORAL | 0 refills | Status: AC

## 2016-04-08 NOTE — Discharge Instructions (Signed)
Postpartum Tubal Ligation, Care After °Refer to this sheet in the next few weeks. These instructions provide you with information about caring for yourself after your procedure. Your health care provider may also give you more specific instructions. Your treatment has been planned according to current medical practices, but problems sometimes occur. Call your health care provider if you have any problems or questions after your procedure. °What can I expect after the procedure? °After the procedure, it is common to have: °· A sore throat. °· Bruising or pain in your back. °· Nausea or vomiting. °· Dizziness. °· Mild abdominal discomfort or pain, such as cramping, gas pain, or feeling bloated. °· Soreness where the incision was made. °· Tiredness. °· Pain in your shoulders. °Follow these instructions at home: °Medicines  °· Take over-the-counter and prescription medicines only as told by your health care provider. °· Do not take aspirin because it can cause bleeding. °· Do not drive or operate heavy machinery while taking prescription pain medicine. °Activity  °· Rest for the rest of the day. °· Gradually return to your normal activities over the next few days. °· Do not have sex, douche, or put a tampon or anything else in your vagina for 6 weeks or as long as told by your health care provider. °· Do not lift anything that is heavier than your baby for 2 weeks or as long as told by your health care provider. °Incision care  °· Follow instructions from your health care provider about how to take care of your incision. Make sure you: °¨ Wash your hands with soap and water before you change your bandage (dressing). If soap and water are not available, use hand sanitizer. °¨ Change your dressing as told by your health care provider. °¨ Leave stitches (sutures) in place. They may need to stay in place for 2 weeks or longer. °· Check your incision area every day for signs of infection. Check for: °¨ More redness,  swelling, or pain. °¨ More fluid or blood. °¨ Warmth. °¨ Pus or a bad smell. °Other Instructions  °· Do not take baths, swim, or use a hot tub until your health care provider approves. You may take showers. °· Keep all follow-up visits as told by your health care provider. This is important. °Contact a health care provider if: °· You have more redness, swelling, or pain around your incision. °· Your incision feels warm to the touch. °· You have pus or a bad smell coming from your incision. °· The edges of your incision break open after the sutures have been removed. °· Your pain does not improve after 2-3 days. °· You have a rash. °· You repeatedly become dizzy or lightheaded. °· Your pain medicine is not helping. °· You are constipated. °Get help right away if: °· You have a fever. °· You faint. °· You have pain in your abdomen that gets worse. °· You have fluid or blood coming from your sutures. °· You have shortness of breath or difficulty breathing. °· You have chest pain or leg pain. °· You have ongoing nausea or diarrhea. °This information is not intended to replace advice given to you by your health care provider. Make sure you discuss any questions you have with your health care provider. °Document Released: 06/23/2011 Document Revised: 05/27/2015 Document Reviewed: 12/02/2014 °Elsevier Interactive Patient Education © 2017 [employer] °Home Care Instructions for Mom ° ACTIVITY °· Gradually return to your regular activities. °· Let yourself rest. Nap while your   baby sleeps. °· Avoid lifting anything that is heavier than 10 lb (4.5 kg) until your health care provider says it is okay. °· Avoid activities that take a lot of effort and energy (are strenuous) until approved by your health care provider. Walking at a slow-to-moderate pace is usually safe. °· If you had a cesarean delivery: °¨ Do not vacuum, climb stairs, or drive a car for 4-6 weeks. °¨ Have someone help you at home until you feel like you can do  your usual activities yourself. °¨ Do exercises as told by your health care provider, if this applies. °VAGINAL BLEEDING °You may continue to bleed for 4-6 weeks after delivery. Over time, the amount of blood usually decreases and the color of the blood usually gets lighter. However, the flow of bright red blood may increase if you have been too active. If you need to use more than one pad in an hour because your pad gets soaked, or if you pass a large clot: °· Lie down. °· Raise your feet. °· Place a cold compress on your lower abdomen. °· Rest. °· Call your health care provider. °If you are breastfeeding, your period should return anytime between 8 weeks after delivery and the time that you stop breastfeeding. If you are not breastfeeding, your period should return 6-8 weeks after delivery. °PERINEAL CARE °The perineal area, or perineum, is the part of your body between your thighs. After delivery, this area needs special care. Follow these instructions as told by your health care provider. °· Take warm tub baths for 15-20 minutes. °· Use medicated pads and pain-relieving sprays and creams as told. °· Do not use tampons or douches until vaginal bleeding has stopped. °· Each time you go to the bathroom: °¨ Use a peri bottle. °¨ Change your pad. °¨ Use towelettes in place of toilet paper until your stitches have healed. °· Do Kegel exercises every day. Kegel exercises help to maintain the muscles that support the vagina, bladder, and bowels. You can do these exercises while you are standing, sitting, or lying down. To do Kegel exercises: °¨ Tighten the muscles of your abdomen and the muscles that surround your birth canal. °¨ Hold for a few seconds. °¨ Relax. °¨ Repeat until you have done this 5 times in a row. °· To prevent hemorrhoids from developing or getting worse: °¨ Drink enough fluid to keep your urine clear or pale yellow. °¨ Avoid straining when having a bowel movement. °¨ Take over-the-counter medicines  and stool softeners as told by your health care provider. °BREAST CARE °· Wear a tight-fitting bra. °· Avoid taking over-the-counter pain medicine for breast discomfort. °· Apply ice to the breasts to help with discomfort as needed: °¨ Put ice in a plastic bag. °¨ Place a towel between your skin and the bag. °¨ Leave the ice on for 20 minutes or as told by your health care provider. °NUTRITION °· Eat a well-balanced diet. °· Do not try to lose weight quickly by cutting back on calories. °· Take your prenatal vitamins until your postpartum checkup or until your health care provider tells you to stop. °POSTPARTUM DEPRESSION °You may find yourself crying for no apparent reason and unable to cope with all of the changes that come with having a newborn. This mood is called postpartum depression. Postpartum depression happens because your hormone levels change after delivery. If you have postpartum depression, get support from your partner, friends, and family. If the depression does not go away   on its own after several weeks, contact your health care provider. °BREAST SELF-EXAM °Do a breast self-exam each month, at the same time of the month. If you are breastfeeding, check your breasts just after a feeding, when your breasts are less full. If you are breastfeeding and your period has started, check your breasts on day 5, 6, or 7 of your period. °Report any lumps, bumps, or discharge to your health care provider. Know that breasts are normally lumpy if you are breastfeeding. This is temporary, and it is not a health risk. °INTIMACY AND SEXUALITY °Avoid sexual activity for at least 3-4 weeks after delivery or until the brownish-red vaginal flow is completely gone. If you want to avoid pregnancy, use some form of birth control. You can get pregnant after delivery, even if you have not had your period. °SEEK MEDICAL CARE IF: °· You feel unable to cope with the changes that a child brings to your life, and these feelings do  not go away after several weeks. °· You notice a lump, a bump, or discharge on your breast. °SEEK IMMEDIATE MEDICAL CARE IF: °· Blood soaks your pad in 1 hour or less. °· You have: °¨ Severe pain or cramping in your lower abdomen. °¨ A bad-smelling vaginal discharge. °¨ A fever that is not controlled by medicine. °¨ A fever, and an area of your breast is red and sore. °¨ Pain or redness in your calf. °¨ Sudden, severe chest pain. °¨ Shortness of breath. °¨ Painful or bloody urination. °¨ Problems with your vision. °· You vomit for 12 hours or longer. °· You develop a severe headache. °· You have serious thoughts about hurting yourself, your child, or anyone else. °This information is not intended to replace advice given to you by your health care provider. Make sure you discuss any questions you have with your health care provider. °Document Released: 12/20/1999 Document Revised: 05/30/2015 Document Reviewed: 06/25/2014 °Elsevier Interactive Patient Education © 2017 Elsevier Inc. ° °

## 2016-04-08 NOTE — Discharge Summary (Signed)
OB Discharge Summary  Patient Name: Lisa Hunt DOB: 1980/03/02 MRN: [id number]  Date of admission: 04/06/2016 Delivering MD: Lovena Neighbours   Date of discharge: 04/08/2016  Admitting diagnosis: LABOR desires sterilization  Intrauterine pregnancy: [redacted]w[redacted]d     Secondary diagnosis:Active Problems:   Normal labor  Additional problems:borderline elevated BPs during hospital stay. Pre eclampsia labs were normal.     Discharge diagnosis: Term Pregnancy Delivered                                                                     Post partum procedures:postpartum tubal ligation   Hospital course:  Onset of Labor With Vaginal Delivery     36 y.o. yo [id number] at [redacted]w[redacted]d was admitted in Active Labor on 04/06/2016. Patient had an uncomplicated labor course as follows:  Membrane Rupture Time/Date: 7:34 PM ,04/06/2016   Intrapartum Procedures: Episiotomy: None [1]                                         Lacerations:  None [1]  Patient had a delivery of a Viable infant. 04/06/2016  Information for the patient's newborn:  Marieann, Zipp [[id number]]  Delivery Method: Vaginal, Spontaneous Delivery (Filed from Delivery Summary)    Pateint had an uncomplicated postpartum course.  She is ambulating, tolerating a regular diet, passing flatus, and urinating well. Patient is discharged home in stable condition on 04/08/16.   Physical exam  Vitals:   04/07/16 1720 04/07/16 2203 04/08/16 0139 04/08/16 0539  BP: 128/88 (!) 142/90 133/83 (!) 156/95  Pulse: 67 66 62 69  Resp: Temp: 98.9 F (37.2 C) 98.4 F (36.9 C) 98.2 F (36.8 C) 98.1 F (36.7 C)  TempSrc: Oral Oral Axillary Axillary  SpO2:  99% 99% 100%  Weight:      Height:       General: alert Lochia: appropriate Uterine Fundus: firm Incision: Dressing is clean, dry, and intact DVT Evaluation: No evidence of DVT seen on physical exam. Labs: Lab Results  Component Value Date   WBC 8.7 04/06/2016   HGB 11.3  (L) 04/06/2016   HCT 33.8 (L) 04/06/2016   MCV 81.4 04/06/2016   PLT 121 (L) 04/06/2016   CMP Latest Ref Rng & Units 04/06/2016  Glucose 65 - 99 mg/dL 66  BUN 6 - 20 mg/dL 7  Creatinine 4.03 - 4.74 mg/dL 2.59  Sodium 563 - 875 mmol/L 133(L)  Potassium 3.5 - 5.1 mmol/L 3.8  Chloride 101 - 111 mmol/L 104  CO2 22 - 32 mmol/L 21(L)  Calcium 8.9 - 10.3 mg/dL 6.4(P)  Total Protein 6.5 - 8.1 g/dL 6.8  Total Bilirubin 0.3 - 1.2 mg/dL 0.6  Alkaline Phos 38 - 126 U/L 129(H)  AST 15 - 41 U/L 30  ALT 14 - 54 U/L 15    Discharge instruction: per After Visit Summary and "Baby and Me Booklet".  After Visit Meds:  Allergies as of 04/08/2016   No Known Allergies     Medication List    STOP taking these medications   acetaminophen 500 MG tablet Commonly known as:  TYLENOL  TAKE these medications   ibuprofen 600 MG tablet Commonly known as:  ADVIL,MOTRIN Take 1 tablet (600 mg total) by mouth every 6 (six) hours.   oxyCODONE-acetaminophen 5-325 MG tablet Commonly known as:  PERCOCET/ROXICET Take 1-2 tablets by mouth every 6 (six) hours as needed.   VITAFOL GUMMIES 3.33-0.333-34.8 MG Chew CHEW 3 GUMMIES EACH DAY       Diet: routine diet  Activity: Advance as tolerated. Pelvic rest for 6 weeks.   Outpatient follow up: 1 week for a BP check and then 6 weeks postpartum visit Follow up Appt:No future appointments. Follow up visit: No Follow-up on file.  Postpartum contraception: Tubal Ligation  Newborn Data: Live born female  Birth Weight: 6 lb 14.8 oz (3141 g) APGAR: 9, 9  Baby Feeding: Breast Disposition:home with mother   04/08/2016 Allie Bossier, MD

## 2016-04-08 NOTE — Clinical Social Work Maternal (Signed)
  CLINICAL SOCIAL WORK MATERNAL/CHILD NOTE  Patient Details  Name: Lisa Hunt MRN: [id number] Date of Birth: [birth year]  Date:  04/08/2016  Clinical Social Worker Initiating Note:  Laurey Arrow  Date/ Time Initiated:  04/08/16/1103     Child's Name:  Lisa Hunt   Legal Guardian:  Mother (FOB is Crista Curb 04/18/84)   Need for Interpreter:  None   Date of Referral:  04/07/16     Reason for Referral:  Current Substance Use/Substance Use During Pregnancy , Late or No Prenatal Care    Referral Source:  Central Nursery   Address:  [place] [address]  Phone number:  [phone]   Household Members:  Self, Minor Children, Significant Other   Natural Supports (not living in the home):  Immediate Family   Professional Supports: None   Employment: Full-time   Type of Work: Furniture conservator/restorer:      Pensions consultant:  [employer]   Other Resources:  [employer], Physicist, medical    Cultural/Religious Considerations Which May Impact Care:  None Reported  Strengths:  Ability to meet basic needs , Engineer, materials , Home prepared for child    Risk Factors/Current Problems:  Substance Use    Cognitive State:  Able to Concentrate , Alert , Linear Thinking , Insightful    Mood/Affect:  Relaxed , Happy , Calm , Comfortable    CSW Assessment: CSW met with MOB to complete an assessment for hx of substance use and limited PNC.When CSW arrived MOB was adjusting car seat straps and infant was asleep in bassinet. MOB was polite and receptive to meeting with CSW.  CSW explained reason for consult:   CSW inquired about MOB's SA hx and MOB acknowledged the use of marijuana throughout pregnancy. MOB reported that MOB also spoke with MOB's OB regarding MOB's substance use during pregnancy to assist MOB with decreasing MOB's nausea and vomiting. MOB reported MOB's last use was about 3 weeks ago. MOB denies all other substances including illegal drugs,  alcohol, and tobacco.  MOB reports she is a good mom and is prepared to parent infant. CSW explained hospital policy regarding substance use during pregnancy.  CSW made MOB aware that infant's UDS was positive for [facility], and CSW will be making a report to [facility] CPS.  CSW also made MOB aware that CSW will report CDS results to CPS when they are available. MOB  verbalized understanding and no concerns.  MOB reports she is not concerned and she does not have a hx of CPS involvement. CSW offered MOB SA resources and MOB declined.   CSW also inquired about MOB's limited PNC.  MOB reported that MOB's employer is not flexible and MOB had challenges taking off of work to attend appointments.  MOB denied barriers to [facility] and infant's follow-up appointments.     CPS report was made to [place] [facility]).  CPS will follow-up with MOB within 72 hours.   CSW Plan/Description:  Child Protective Service Report , No Further Intervention Required/No Barriers to Discharge, Information/Referral to [place], MSW, [employer] Clinical Social Work [phone]   Dimple Nanas, LCSW 04/08/2016, 11:08 AM

## 2016-04-08 NOTE — Anesthesia Postprocedure Evaluation (Signed)
Anesthesia Post Note  Patient: Lisa Hunt  Procedure(s) Performed: Procedure(s) (LRB): POST PARTUM TUBAL LIGATION (Bilateral)  Patient location during evaluation: PACU Anesthesia Type: Epidural Level of consciousness: patient cooperative Pain management: pain level controlled Vital Signs Assessment: post-procedure vital signs reviewed and stable Respiratory status: spontaneous breathing Cardiovascular status: stable Postop Assessment: epidural receding Anesthetic complications: no       Last Vitals:  Vitals:   04/08/16 0539 04/08/16 1103  BP: (!) 156/95 133/89  Pulse: 69 79  Resp: 18 18  Temp: 36.7 C 36.8 C    Last Pain:  Vitals:   04/08/16 1103  TempSrc: Axillary  PainSc:                  Lewie Loron

## 2016-07-17 NOTE — Addendum Note (Signed)
Addendum  created 07/17/16 1001 by Levi Crass, MD   Sign clinical note    

## 2016-07-17 NOTE — Anesthesia Postprocedure Evaluation (Signed)
Anesthesia Post Note  Patient: Lisa Hunt  Procedure(s) Performed: Procedure(s) (LRB): POST PARTUM TUBAL LIGATION (Bilateral)     Anesthesia Post Evaluation  Last Vitals:  Vitals:   04/08/16 0539 04/08/16 1103  BP: (!) 156/95 133/89  Pulse: 69 79  Resp: 18 18  Temp: 36.7 C 36.8 C    Last Pain:  Vitals:   04/08/16 1103  TempSrc: Axillary  PainSc:                  Lewie LoronJohn Zanyia Silbaugh

## 2016-09-30 ENCOUNTER — Encounter (HOSPITAL_COMMUNITY): Payer: Self-pay

## 2017-05-13 ENCOUNTER — Encounter (HOSPITAL_COMMUNITY): Payer: Self-pay | Admitting: Emergency Medicine

## 2017-05-13 ENCOUNTER — Other Ambulatory Visit: Payer: Self-pay

## 2017-05-13 ENCOUNTER — Emergency Department (HOSPITAL_COMMUNITY): Payer: No Typology Code available for payment source

## 2017-05-13 ENCOUNTER — Emergency Department (HOSPITAL_COMMUNITY)
Admission: EM | Admit: 2017-05-13 | Discharge: 2017-05-13 | Disposition: A | Discharge status: Home / Self Care | Payer: No Typology Code available for payment source | Attending: Emergency Medicine | Admitting: Emergency Medicine

## 2017-05-13 DIAGNOSIS — Z79899 Other long term (current) drug therapy: Secondary | ICD-10-CM | POA: Insufficient documentation

## 2017-05-13 DIAGNOSIS — F17210 Nicotine dependence, cigarettes, uncomplicated: Secondary | ICD-10-CM | POA: Insufficient documentation

## 2017-05-13 DIAGNOSIS — I10 Essential (primary) hypertension: Secondary | ICD-10-CM | POA: Diagnosis not present

## 2017-05-13 DIAGNOSIS — Z041 Encounter for examination and observation following transport accident: Secondary | ICD-10-CM | POA: Diagnosis present

## 2017-05-13 MED ORDER — NAPROXEN 500 MG PO TABS: 500.0000 mg | ORAL_TABLET | Freq: Two times a day (BID) | ORAL | 0 refills | Status: AC

## 2017-05-13 MED ORDER — METHOCARBAMOL 500 MG PO TABS: 500.0000 mg | ORAL_TABLET | Freq: Three times a day (TID) | ORAL | 0 refills | Status: AC | PRN
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
## 2017-09-14 ENCOUNTER — Ambulatory Visit (HOSPITAL_COMMUNITY): Admission: EM | Admit: 2017-09-14 | Discharge: 2017-09-14 | Discharge status: Absent without leave | Payer: Self-pay

## 2018-01-06 ENCOUNTER — Encounter (HOSPITAL_COMMUNITY): Payer: Self-pay | Admitting: Student

## 2018-01-06 ENCOUNTER — Other Ambulatory Visit: Payer: Self-pay

## 2018-01-06 ENCOUNTER — Emergency Department (HOSPITAL_COMMUNITY)
Admission: EM | Admit: 2018-01-06 | Discharge: 2018-01-06 | Disposition: A | Discharge status: Home / Self Care | Payer: Medicaid Other | Attending: Emergency Medicine | Admitting: Emergency Medicine

## 2018-01-06 DIAGNOSIS — J020 Streptococcal pharyngitis: Secondary | ICD-10-CM

## 2018-01-06 DIAGNOSIS — I10 Essential (primary) hypertension: Secondary | ICD-10-CM | POA: Insufficient documentation

## 2018-01-06 DIAGNOSIS — Z79899 Other long term (current) drug therapy: Secondary | ICD-10-CM | POA: Diagnosis not present

## 2018-01-06 DIAGNOSIS — J029 Acute pharyngitis, unspecified: Secondary | ICD-10-CM | POA: Diagnosis present

## 2018-01-06 DIAGNOSIS — F17210 Nicotine dependence, cigarettes, uncomplicated: Secondary | ICD-10-CM | POA: Insufficient documentation

## 2018-01-06 LAB — I-STAT CHEM 8, ED
BUN: 9 mg/dL (ref 6–20)
CHLORIDE: 101 mmol/L (ref 98–111)
Calcium, Ion: 1.11 mmol/L — ABNORMAL LOW (ref 1.15–1.40)
Creatinine, Ser: 0.5 mg/dL (ref 0.44–1.00)
Glucose, Bld: 78 mg/dL (ref 70–99)
HEMATOCRIT: 33 % — AB (ref 36.0–46.0)
Hemoglobin: 11.2 g/dL — ABNORMAL LOW (ref 12.0–15.0)
POTASSIUM: 3.2 mmol/L — AB (ref 3.5–5.1)
SODIUM: 139 mmol/L (ref 135–145)
TCO2: 28 mmol/L (ref 22–32)

## 2018-01-06 LAB — I-STAT BETA HCG BLOOD, ED (MC, WL, AP ONLY): I-stat hCG, quantitative: <5 m[IU]/mL (ref <5)

## 2018-01-06 LAB — GROUP A STREP BY PCR: Group A Strep by PCR: DETECTED — AB

## 2018-01-06 MED ORDER — POTASSIUM CHLORIDE CRYS ER 20 MEQ PO TBCR
40.0000 meq | EXTENDED_RELEASE_TABLET | Freq: Once | ORAL | Status: AC
  Administered 2018-01-06: 40 meq via ORAL
  Filled 2018-01-06: qty 2

## 2018-01-06 MED ORDER — DEXAMETHASONE SODIUM PHOSPHATE 10 MG/ML IJ SOLN
10.0000 mg | Freq: Once | INTRAMUSCULAR | Status: AC
  Administered 2018-01-06: 10 mg via INTRAMUSCULAR
  Filled 2018-01-06: qty 1

## 2018-01-06 MED ORDER — PENICILLIN G BENZATHINE 1200000 UNIT/2ML IM SUSP
1.2000 10*6.[IU] | Freq: Once | INTRAMUSCULAR | Status: AC
  Administered 2018-01-06: 1.2 10*6.[IU] via INTRAMUSCULAR
  Filled 2018-01-06: qty 2

## 2018-01-06 NOTE — ED Triage Notes (Signed)
Pt to ER with sore throat and cough since Fri

## 2018-01-06 NOTE — ED Notes (Signed)
Pt discharged from ED; instructions provided; Pt encouraged to return to ED if symptoms worsen and to f/u with PCP; Pt verbalized understanding of all instructions 

## 2018-01-06 NOTE — ED Provider Notes (Signed)
[facility]ERGENC[facility] Provider Note   CSN: [id number] Arrival date & time: 01/06/18  0753     History   Chief Complaint Chief Complaint  Patient presents with  . Sore Throat    HPI Lisa Hunt is a 38 y.o. female with a hx of tobacco abuse, HTN, and prior chlamydia who presents to the ED with complaints of sore throat x 1 week. Patient states pain is mostly to the R side of the throat - radiates into the neck and the R ear. She states pain is constant, 10/10 in severity, worse with attempts to swallow & open her mouth. Has tried multiple OTC modalities including motrin & cold/flu medicines without relief. She has had minimal congestion. Denies fever, cough, drooling, dental pain, voice change, dyspnea, or vomiting.   HPI  Past Medical History:  Diagnosis Date  . Chlamydia   . Hypertension     Patient Active Problem List   Diagnosis Date Noted  . Normal labor 04/06/2016    Past Surgical History:  Procedure Laterality Date  . TUBAL LIGATION Bilateral 04/07/2016   Procedure: POST PARTUM TUBAL LIGATION;  Surgeon: Levie HeritageJacob J Stinson, DO;  Location: [facility];  Service: Gynecology;  Laterality: Bilateral;  . WISDOM TOOTH EXTRACTION       OB History    Gravida  10   Para  4   Term  4   Preterm      AB  5   Living  4     SAB  2   TAB  3   Ectopic      Multiple      Live Births               Home Medications    Prior to Admission medications   Medication Sig Start Date End Date Taking? Authorizing Provider  ibuprofen (ADVIL,MOTRIN) 600 MG tablet Take 1 tablet (600 mg total) by mouth every 6 (six) hours. 04/08/16   Allie Bossierove, Myra C, MD  methocarbamol (ROBAXIN) 500 MG tablet Take 1 tablet (500 mg total) by mouth every 8 (eight) hours as needed. 05/13/17   ,  R, PA-C  naproxen (NAPROSYN) 500 MG tablet Take 1 tablet (500 mg total) by mouth 2 (two) times daily. 05/13/17   ,  R, PA-C   oxyCODONE-acetaminophen (PERCOCET/ROXICET) 5-325 MG tablet Take 1-2 tablets by mouth every 6 (six) hours as needed. 04/08/16   Allie Bossierove, Myra C, MD  Prenatal Vit-Fe Phos-FA-Omega (VITAFOL GUMMIES) 3.33-0.333-34.8 MG CHEW CHEW 3 GUMMIES EACH DAY 02/07/16   [provider]    Family History No family history on file.  Social History Social History   Tobacco Use  . Smoking status: Current Every Day Smoker    Packs/day: 0.25    Types: Cigarettes  . Smokeless tobacco: Current User  Substance Use Topics  . Alcohol use: No  . Drug use: Yes    Types: Marijuana     Allergies   Patient has no known allergies.   Review of Systems Review of Systems  Constitutional: Negative for chills and fever.  HENT: Positive for congestion (minimal), ear pain, sore throat and trouble swallowing. Negative for dental problem, drooling and voice change.   Respiratory: Negative for cough and shortness of breath.   Cardiovascular: Negative for chest pain.  Gastrointestinal: Negative for nausea and vomiting.  Musculoskeletal: Positive for neck pain.  All other systems reviewed and are negative.    Physical Exam Updated Vital Signs BP (!) 134/102 (  BP Location: Right Arm)   Pulse 60   Temp 99.1 F (37.3 C) (Oral)   Resp 16   SpO2 100%   Physical Exam Vitals signs and nursing note reviewed.  Constitutional:      General: She is not in acute distress.    Appearance: She is well-developed. She is not toxic-appearing.  HENT:     Head: Normocephalic and atraumatic.     Jaw: Pain on movement present. No tenderness.     Comments: Patient able to open mouth 2-3 finger widths.     Right Ear: Tympanic membrane, ear canal and external ear normal. Tympanic membrane is not perforated, erythematous, retracted or bulging.     Left Ear: Tympanic membrane, ear canal and external ear normal. Tympanic membrane is not perforated, erythematous, retracted or bulging.     Nose: Nose normal.     Mouth/Throat:      Pharynx: Uvula midline. Posterior oropharyngeal erythema present. No oropharyngeal exudate.     Comments: Posterior oropharynx is symmetric appearing. Patient tolerating own secretions without difficulty.  No hot potato voice. No swelling beneath the tongue, submandibular compartment is soft.  Eyes:     General:        Right eye: No discharge.        Left eye: No discharge.     Conjunctiva/sclera: Conjunctivae normal.     Pupils: Pupils are equal, round, and reactive to light.  Neck:     Musculoskeletal: Normal range of motion and neck supple. No edema, erythema, neck rigidity or crepitus.     Comments: Right anterior neck tender to palpation diffusely.  Cardiovascular:     Rate and Rhythm: Normal rate and regular rhythm.     Heart sounds: No murmur.  Pulmonary:     Effort: No respiratory distress.     Breath sounds: Normal breath sounds. No wheezing or rales.  Abdominal:     General: There is no distension.     Palpations: Abdomen is soft.     Tenderness: There is no abdominal tenderness.  Lymphadenopathy:     Cervical: Cervical adenopathy (right anterior w/ tenderness to palpation) present.  Skin:    General: Skin is warm and dry.     Findings: No rash.  Neurological:     Mental Status: She is alert.  Psychiatric:        Behavior: Behavior normal.      ED Treatments / Results  Labs (all labs ordered are listed, but only abnormal results are displayed) Labs Reviewed  GROUP A STREP BY PCR - Abnormal; Notable for the following components:      Result Value   Group A Strep by PCR DETECTED (*)    All other components within normal limits  I-STAT CHEM 8, ED - Abnormal; Notable for the following components:   Potassium 3.2 (*)    Calcium, Ion 1.11 (*)    Hemoglobin 11.2 (*)    HCT 33.0 (*)    All other components within normal limits  I-STAT BETA HCG BLOOD, ED ([facility], [facility], [facility] ONLY)    EKG None  Radiology No results found.  Procedures Procedures (including critical  care time)  Medications Ordered in ED Medications  penicillin g benzathine (BICILLIN LA) 1200000 UNIT/2ML injection 1.2 Million Units (has no administration in time range)  dexamethasone (DECADRON) injection 10 mg (has no administration in time range)  potassium chloride SA (K-DUR,KLOR-CON) CR tablet 40 mEq (has no administration in time range)     Initial  Impression / Assessment and Plan / ED Course  I have reviewed the triage vital signs and the nursing notes.  Pertinent labs & imaging results that were available during my care of the patient were reviewed by me and considered in my medical decision making (see chart for details).   Patient presents to the ED with sore throat. Nontoxic appearing, no apparent distress, vitals WNL with the exception of elevated BP- doubt HTN emergency. Feel strep vs. Viral pharyngitis is most likely, however patient has mild trismus on exam with tenderness to palpation to diffuse right anterior neck therefore will proceed with I-stat chem 8 w/ CT soft tissue neck to evaluate for deep space infection in addition to strep swab.   Anemia @ baseline. Mildly low potassium given oral supplementation & diet recommendations. Patient strep test is positive- likely cause of discomfort. Patient unfortunately waiting for extended period of time for CT imaging secondary to business of the department and is requesting discharge. We discussed purpose of CT imaging as well as risks/benefits/alternatives including awaiting CT imaging in the department vs. tx for strep w/ PCP follow up and very strict return precautions- she would prefer tx and discharge home. I feel this is reasonable and w/ identifiable cause of strep and overall well appearance feel that deep space infection is of low probability. IM Bicillin & Decadron in the ER. I discussed results, treatment plan, need for follow-up, and strict return precautions with the patient. Provided opportunity for questions, patient  confirmed understanding and is in agreement with plan.    Final Clinical Impressions(s) / ED Diagnoses   Final diagnoses:  Strep pharyngitis    ED Discharge Orders    None       Desmond Lope 01/06/18 1117    Tilden Fossa, MD 01/07/18 [id number]

## 2018-01-06 NOTE — Discharge Instructions (Addendum)
You are seen in the emergency department today for sore throat, your strep test was positive.  You were given antibiotics and steroids in the ER to help with this.  Please take Motrin/Tylenol at home for any continued discomfort.  Your potassium was also a bit low, please follow the attached guidelines for diet for this.  We would like you to follow closely with primary care within the next 1 week.  As we discussed it is important that should your symptoms worsen including worsening pain, inability to move your neck, inability to swallow, or any other concerns return to the ER immediately.

## 2018-06-08 IMAGING — US US MFM OB LIMITED
1 series · 15 of 20 positions shown · non-contrast
Comparison: none

[Series 1: us mfm ob limited · 15 of 20 slices shown]
[im 1/20]
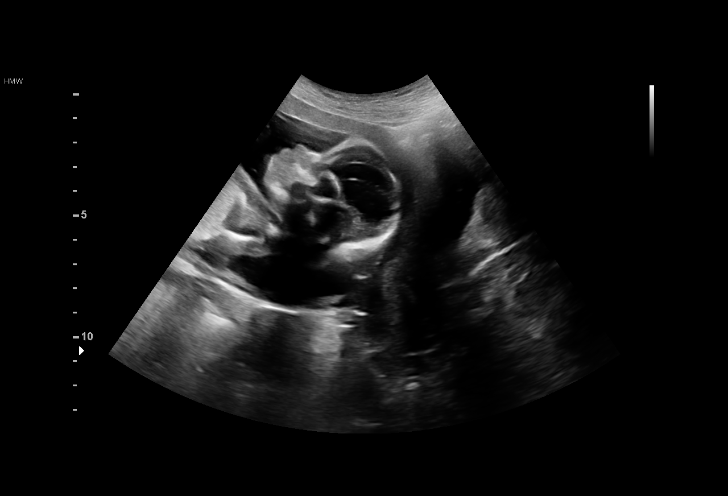
[im 3/20]
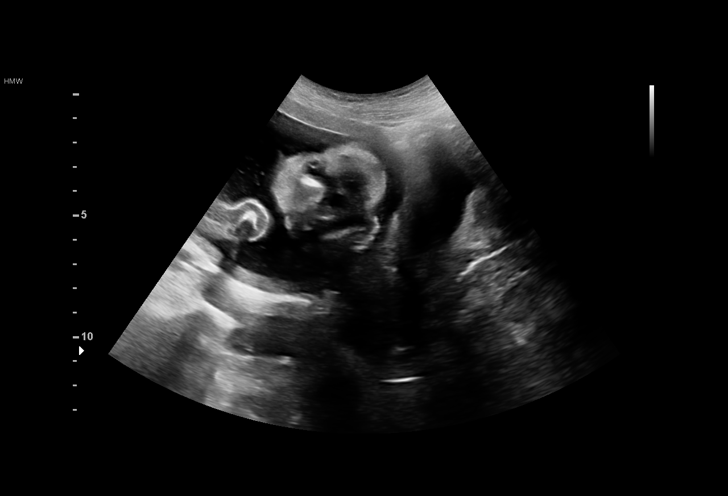
[im 4/20]
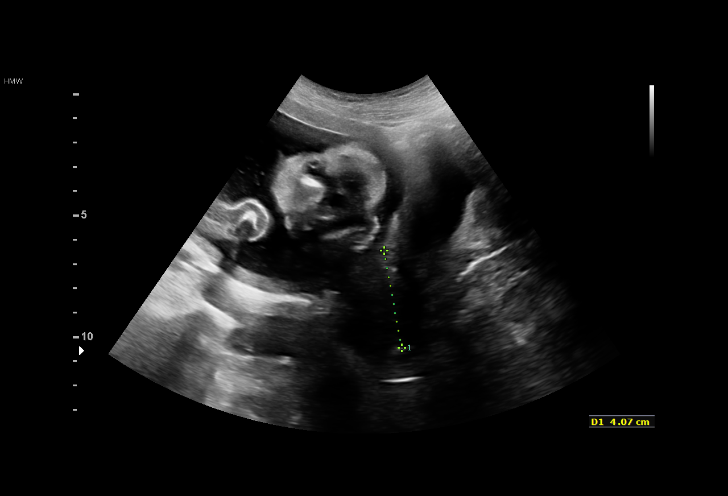
[im 5/20]
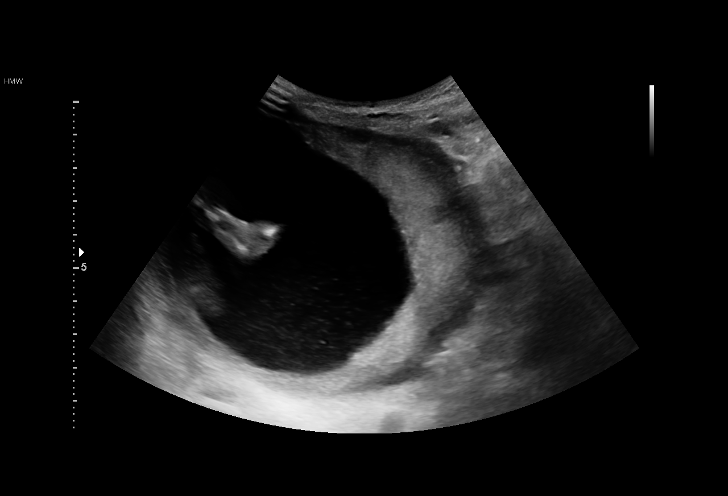
[im 7/20]
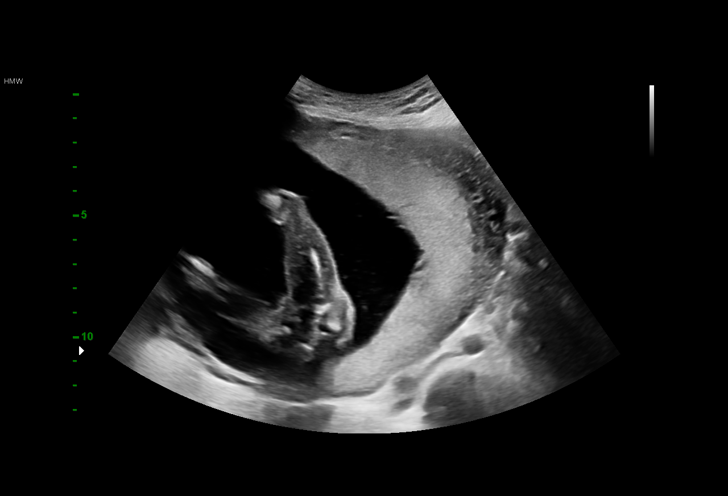
[im 8/20]
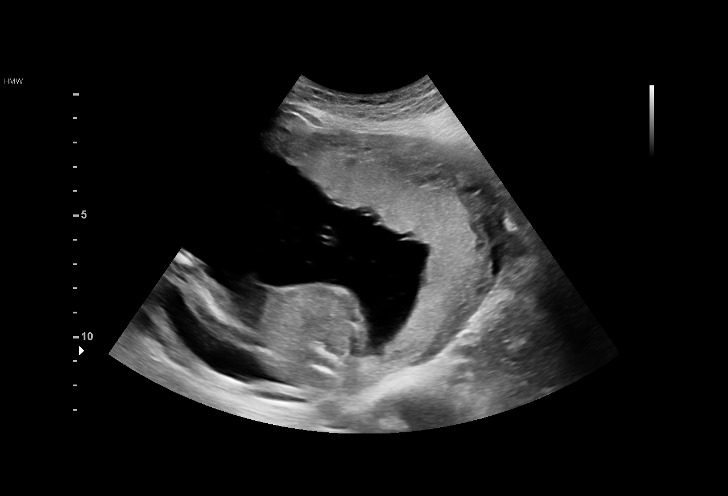
[im 9/20]
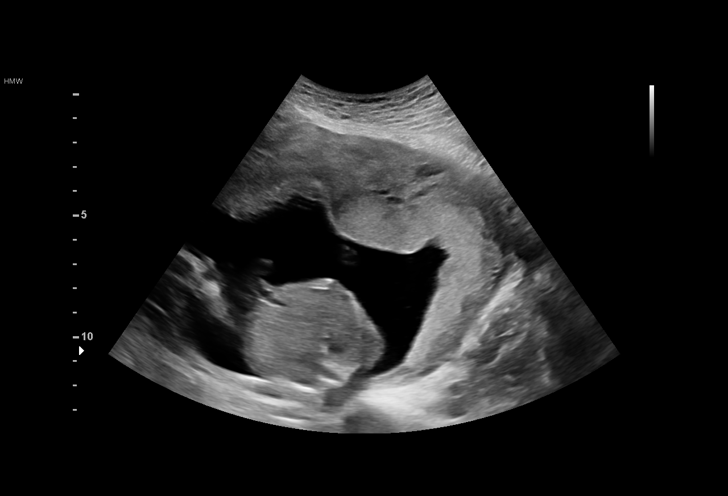
[im 11/20]
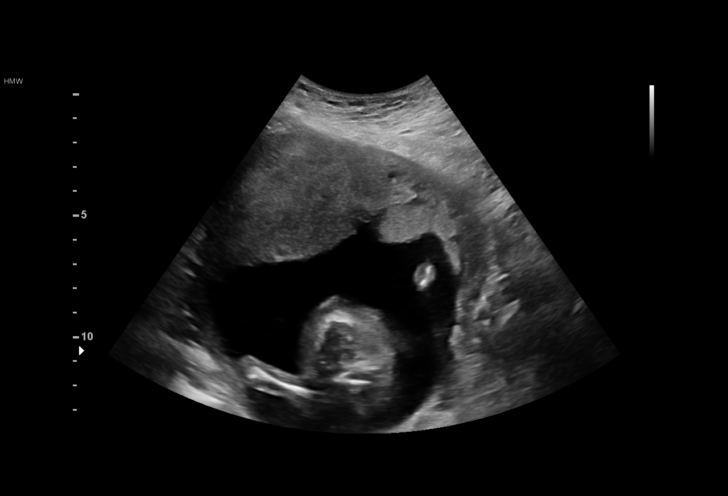
[im 12/20]
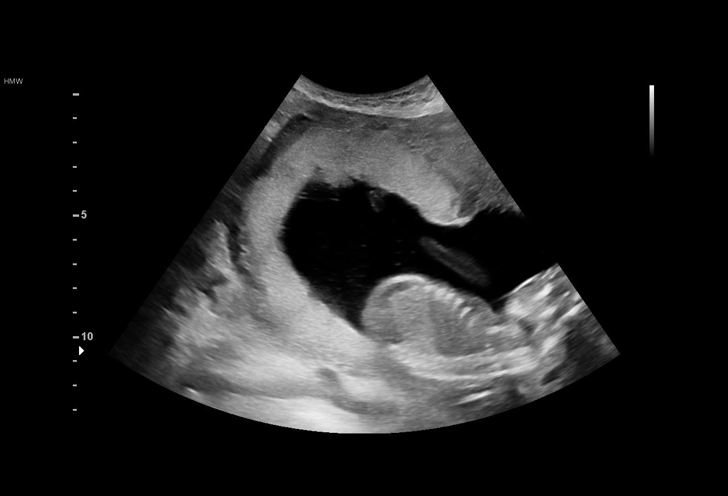
[im 13/20]
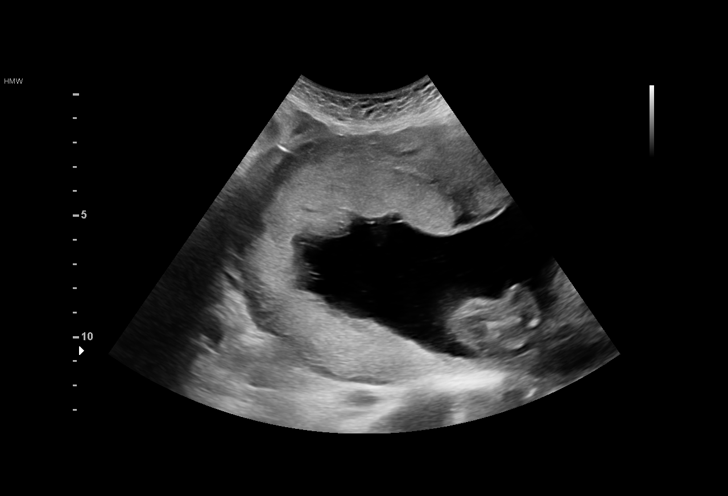
[im 15/20]
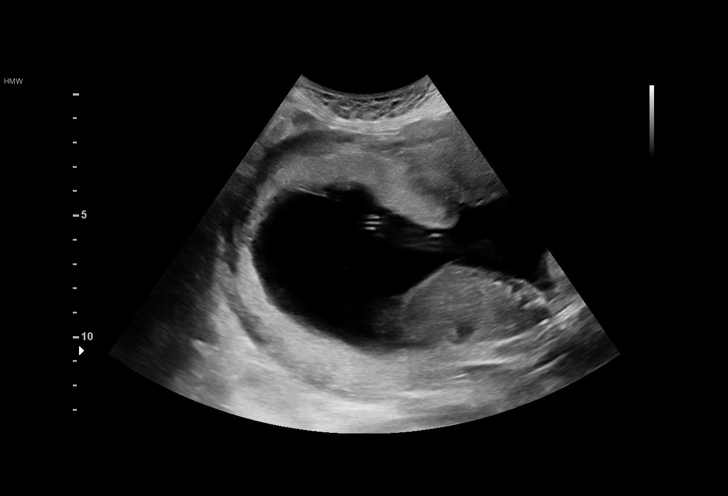
[im 16/20]
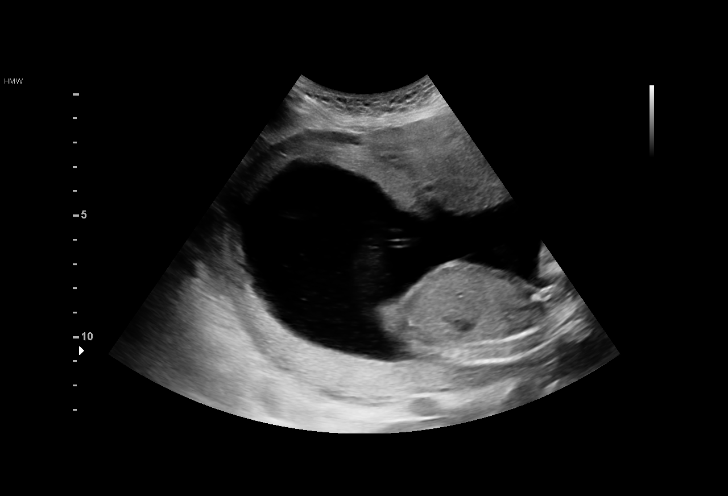
[im 17/20]
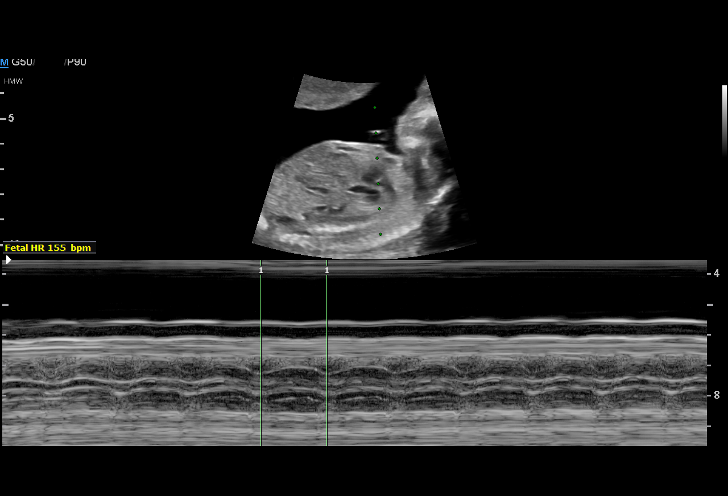
[im 19/20]
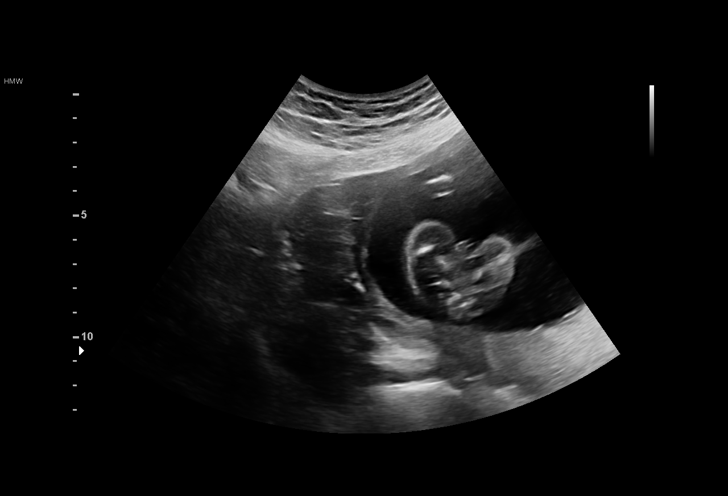
[im 20/20]
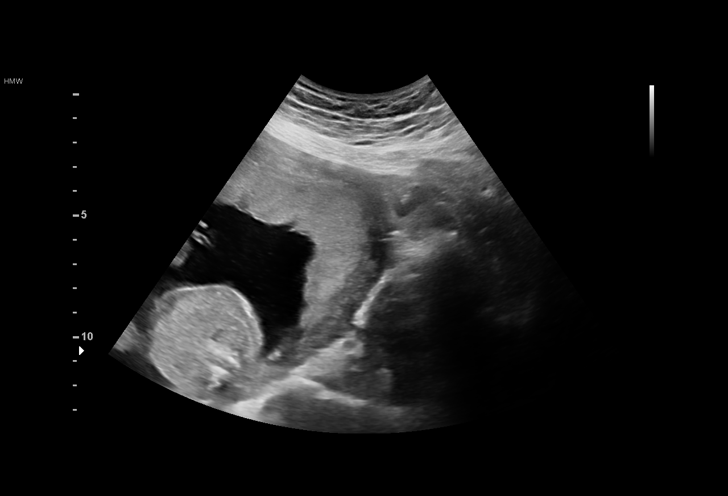

[15 of 20 positions shown; findings below may reference images not displayed]

Indications

19 weeks gestation of pregnancy
Vaginal bleeding in pregnancy, second
trimester
Abdominal pain in pregnancy
OB History

Gravidity:    10        Term:   4         SAB:   2
TOP:          3         Living: 4
Fetal Evaluation

Num Of Fetuses:     1
Fetal Heart         155
Rate(bpm):
Cardiac Activity:   Observed
Presentation:       Cephalic
Placenta:           Fundal, above cervical os
P. Cord Insertion:  Visualized, central

Amniotic Fluid
AFI FV:      Subjectively within normal limits

Largest Pocket(cm)
3.9
Gestational Age

LMP:           19w 5d        Date:  07/11/15                 EDD:    04/16/16
Best:          19w 5d     Det. By:  LMP  (07/11/15)          EDD:    04/16/16
Cervix Uterus Adnexa

Cervix
Length:            3.9  cm.
Normal appearance by transabdominal scan.
Uterus
No abnormality visualized.

Left Ovary
No adnexal mass visualized.

Right Ovary
No adnexal mass visualized.

Cul De Sac:   No free fluid seen.

Adnexa:       No abnormality visualized.
Impression

SIUP at 19+5 weeks
Normal amniotic fluid volume
Fundal placenta; no previa; no subchorionic fluid
collections/hemorrhage identified
No adnexal abnormality
Recommendations

Follow-up as clinically indicated

## 2018-07-05 ENCOUNTER — Other Ambulatory Visit: Payer: Self-pay

## 2018-07-05 ENCOUNTER — Emergency Department (HOSPITAL_COMMUNITY): Payer: Medicaid Other

## 2018-07-05 ENCOUNTER — Emergency Department (HOSPITAL_COMMUNITY)
Admission: EM | Admit: 2018-07-05 | Discharge: 2018-07-05 | Disposition: A | Discharge status: Home / Self Care | Payer: Medicaid Other | Attending: Emergency Medicine | Admitting: Emergency Medicine

## 2018-07-05 ENCOUNTER — Encounter (HOSPITAL_COMMUNITY): Payer: Self-pay | Admitting: Emergency Medicine

## 2018-07-05 DIAGNOSIS — S92314A Nondisplaced fracture of first metatarsal bone, right foot, initial encounter for closed fracture: Secondary | ICD-10-CM | POA: Diagnosis not present

## 2018-07-05 DIAGNOSIS — M549 Dorsalgia, unspecified: Secondary | ICD-10-CM | POA: Diagnosis not present

## 2018-07-05 DIAGNOSIS — Y999 Unspecified external cause status: Secondary | ICD-10-CM | POA: Insufficient documentation

## 2018-07-05 DIAGNOSIS — M542 Cervicalgia: Secondary | ICD-10-CM | POA: Insufficient documentation

## 2018-07-05 DIAGNOSIS — S52614A Nondisplaced fracture of right ulna styloid process, initial encounter for closed fracture: Secondary | ICD-10-CM

## 2018-07-05 DIAGNOSIS — F17210 Nicotine dependence, cigarettes, uncomplicated: Secondary | ICD-10-CM | POA: Insufficient documentation

## 2018-07-05 DIAGNOSIS — D649 Anemia, unspecified: Secondary | ICD-10-CM | POA: Diagnosis not present

## 2018-07-05 DIAGNOSIS — S50812A Abrasion of left forearm, initial encounter: Secondary | ICD-10-CM | POA: Insufficient documentation

## 2018-07-05 DIAGNOSIS — R10817 Generalized abdominal tenderness: Secondary | ICD-10-CM | POA: Diagnosis not present

## 2018-07-05 DIAGNOSIS — F1290 Cannabis use, unspecified, uncomplicated: Secondary | ICD-10-CM | POA: Insufficient documentation

## 2018-07-05 DIAGNOSIS — T07XXXA Unspecified multiple injuries, initial encounter: Secondary | ICD-10-CM | POA: Diagnosis present

## 2018-07-05 DIAGNOSIS — R0789 Other chest pain: Secondary | ICD-10-CM | POA: Diagnosis not present

## 2018-07-05 DIAGNOSIS — R51 Headache: Secondary | ICD-10-CM | POA: Diagnosis not present

## 2018-07-05 DIAGNOSIS — Y939 Activity, unspecified: Secondary | ICD-10-CM | POA: Insufficient documentation

## 2018-07-05 DIAGNOSIS — Y929 Unspecified place or not applicable: Secondary | ICD-10-CM | POA: Diagnosis not present

## 2018-07-05 DIAGNOSIS — I10 Essential (primary) hypertension: Secondary | ICD-10-CM | POA: Diagnosis not present

## 2018-07-05 LAB — COMPREHENSIVE METABOLIC PANEL
ALT: 20 U/L (ref 0–44)
AST: 34 U/L (ref 15–41)
Albumin: 3.8 g/dL (ref 3.5–5.0)
Alkaline Phosphatase: 46 U/L (ref 38–126)
Anion gap: 9 (ref 5–15)
BUN: 8 mg/dL (ref 6–20)
CO2: 23 mmol/L (ref 22–32)
Calcium: 8.8 mg/dL — ABNORMAL LOW (ref 8.9–10.3)
Chloride: 106 mmol/L (ref 98–111)
Creatinine, Ser: 0.92 mg/dL (ref 0.44–1.00)
GFR calc Af Amer: >60 mL/min (ref >60)
GFR calc non Af Amer: >60 mL/min (ref >60)
Glucose, Bld: 90 mg/dL (ref 70–99)
Potassium: 3.9 mmol/L (ref 3.5–5.1)
Sodium: 138 mmol/L (ref 135–145)
Total Bilirubin: 0.6 mg/dL (ref 0.3–1.2)
Total Protein: 6.5 g/dL (ref 6.5–8.1)

## 2018-07-05 LAB — I-STAT BETA HCG BLOOD, ED (MC, WL, AP ONLY): I-stat hCG, quantitative: <5 m[IU]/mL (ref <5)

## 2018-07-05 LAB — CBC
HCT: 32.9 % — ABNORMAL LOW (ref 36.0–46.0)
Hemoglobin: 9.9 g/dL — ABNORMAL LOW (ref 12.0–15.0)
MCH: 23.7 pg — ABNORMAL LOW (ref 26.0–34.0)
MCHC: 30.1 g/dL (ref 30.0–36.0)
MCV: 78.9 fL — ABNORMAL LOW (ref 80.0–100.0)
Platelets: 226 10*3/uL (ref 150–400)
RBC: 4.17 MIL/uL (ref 3.87–5.11)
RDW: 17.2 % — ABNORMAL HIGH (ref 11.5–15.5)
WBC: 11.7 10*3/uL — ABNORMAL HIGH (ref 4.0–10.5)
nRBC: 0 % (ref 0.0–0.2)

## 2018-07-05 MED ORDER — IOHEXOL 300 MG/ML  SOLN
100.0000 mL | Freq: Once | INTRAMUSCULAR | Status: AC | PRN
  Administered 2018-07-05: 100 mL via INTRAVENOUS

## 2018-07-05 MED ORDER — ONDANSETRON HCL 4 MG/2ML IJ SOLN
4.0000 mg | Freq: Once | INTRAMUSCULAR | Status: AC
  Administered 2018-07-05: 4 mg via INTRAVENOUS
  Filled 2018-07-05: qty 2

## 2018-07-05 MED ORDER — OXYCODONE-ACETAMINOPHEN 5-325 MG PO TABS
1.0000 | ORAL_TABLET | Freq: Once | ORAL | Status: AC
  Administered 2018-07-05: 1 via ORAL
  Filled 2018-07-05: qty 1

## 2018-07-05 MED ORDER — OXYCODONE-ACETAMINOPHEN 5-325 MG PO TABS: 1.0000 | ORAL_TABLET | Freq: Four times a day (QID) | ORAL | 0 refills | Status: AC | PRN

## 2018-07-05 MED ORDER — MORPHINE SULFATE (PF) 4 MG/ML IV SOLN
4.0000 mg | Freq: Once | INTRAVENOUS | Status: AC
  Administered 2018-07-05: 4 mg via INTRAVENOUS
  Filled 2018-07-05: qty 1
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
## 2018-07-05 NOTE — Progress Notes (Signed)
Orthopedic Tech Progress Note Patient Details:  Lisa Hunt [birth year] [id number]  Ortho Devices Type of Ortho Device: Arm sling, Sugartong splint, CAM walker Ortho Device/Splint Location: applied rue sugartong at [employer] request. Ortho Device/Splint Interventions: Ordered, Application, Adjustment   Post Interventions Patient Tolerated: Well Instructions Provided: Care of device, Adjustment of device   Karolee Stamps 07/05/2018, 11:24 PM
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
## 2018-07-05 NOTE — ED Notes (Signed)
Abrasions to left forearm

## 2018-08-31 ENCOUNTER — Encounter (HOSPITAL_BASED_OUTPATIENT_CLINIC_OR_DEPARTMENT_OTHER): Payer: Self-pay | Admitting: Emergency Medicine

## 2018-08-31 ENCOUNTER — Other Ambulatory Visit: Payer: Self-pay

## 2018-08-31 ENCOUNTER — Emergency Department (HOSPITAL_BASED_OUTPATIENT_CLINIC_OR_DEPARTMENT_OTHER)
Admission: EM | Admit: 2018-08-31 | Discharge: 2018-08-31 | Disposition: A | Discharge status: Home / Self Care | Payer: Medicaid Other | Attending: Emergency Medicine | Admitting: Emergency Medicine

## 2018-08-31 DIAGNOSIS — R5383 Other fatigue: Secondary | ICD-10-CM | POA: Diagnosis not present

## 2018-08-31 DIAGNOSIS — F17210 Nicotine dependence, cigarettes, uncomplicated: Secondary | ICD-10-CM | POA: Insufficient documentation

## 2018-08-31 DIAGNOSIS — D649 Anemia, unspecified: Secondary | ICD-10-CM | POA: Diagnosis not present

## 2018-08-31 DIAGNOSIS — I10 Essential (primary) hypertension: Secondary | ICD-10-CM | POA: Insufficient documentation

## 2018-08-31 DIAGNOSIS — R6883 Chills (without fever): Secondary | ICD-10-CM | POA: Diagnosis present

## 2018-08-31 DIAGNOSIS — R6889 Other general symptoms and signs: Secondary | ICD-10-CM

## 2018-08-31 LAB — CBC WITH DIFFERENTIAL/PLATELET
Abs Immature Granulocytes: 0.01 10*3/uL (ref 0.00–0.07)
Basophils Absolute: 0 10*3/uL (ref 0.0–0.1)
Basophils Relative: 0 %
Eosinophils Absolute: 0.1 10*3/uL (ref 0.0–0.5)
Eosinophils Relative: 1 %
HCT: 35.4 % — ABNORMAL LOW (ref 36.0–46.0)
Hemoglobin: 10.5 g/dL — ABNORMAL LOW (ref 12.0–15.0)
Immature Granulocytes: 0 %
Lymphocytes Relative: 27 %
Lymphs Abs: 1.7 10*3/uL (ref 0.7–4.0)
MCH: 24.2 pg — ABNORMAL LOW (ref 26.0–34.0)
MCHC: 29.7 g/dL — ABNORMAL LOW (ref 30.0–36.0)
MCV: 81.8 fL (ref 80.0–100.0)
Monocytes Absolute: 0.3 10*3/uL (ref 0.1–1.0)
Monocytes Relative: 5 %
Neutro Abs: 4.1 10*3/uL (ref 1.7–7.7)
Neutrophils Relative %: 67 %
Platelets: 246 10*3/uL (ref 150–400)
RBC: 4.33 MIL/uL (ref 3.87–5.11)
RDW: 15.1 % (ref 11.5–15.5)
WBC: 6.1 10*3/uL (ref 4.0–10.5)
nRBC: 0 % (ref 0.0–0.2)

## 2018-08-31 LAB — COMPREHENSIVE METABOLIC PANEL
ALT: 14 U/L (ref 0–44)
AST: 23 U/L (ref 15–41)
Albumin: 4.2 g/dL (ref 3.5–5.0)
Alkaline Phosphatase: 51 U/L (ref 38–126)
Anion gap: 9 (ref 5–15)
BUN: 8 mg/dL (ref 6–20)
CO2: 24 mmol/L (ref 22–32)
Calcium: 9 mg/dL (ref 8.9–10.3)
Chloride: 106 mmol/L (ref 98–111)
Creatinine, Ser: 0.65 mg/dL (ref 0.44–1.00)
GFR calc Af Amer: >60 mL/min (ref >60)
GFR calc non Af Amer: >60 mL/min (ref >60)
Glucose, Bld: 99 mg/dL (ref 70–99)
Potassium: 4 mmol/L (ref 3.5–5.1)
Sodium: 139 mmol/L (ref 135–145)
Total Bilirubin: 0.6 mg/dL (ref 0.3–1.2)
Total Protein: 7.3 g/dL (ref 6.5–8.1)

## 2018-08-31 LAB — TSH: TSH: 0.789 u[IU]/mL (ref 0.350–4.500)

## 2018-08-31 LAB — PREGNANCY, URINE: Preg Test, Ur: NEGATIVE

## 2018-08-31 NOTE — ED Provider Notes (Signed)
[facility] EMERGENCY DEPARTMENT Provider Note   CSN: [id number] Arrival date & time: 08/31/18  1026     History   Chief Complaint Chief Complaint  Patient presents with  . chills and weakness    HPI Lisa Hunt is a 38 y.o. female.     HPI  38 year old female presents with feeling cold.  Has been ongoing for 2 or 3 weeks.  It only occurs at work and she has to put on multiple jackets.  She states there are fans at work and she is currently working in a factory type setting.  When she is at home she does not have any air on at all and feels comfortable.  She states her weight has fluctuated recently but no specific weight loss or weight gain.  Has chronic headaches but no new headache.  No fever or chills, but it is more of a constant cold sensation.  She also feels generally fatigued and sometimes has some aches but no joint swelling.  No vomiting, cough, shortness of breath, chest pain or abdominal pain.  No urinary symptoms.  Has not noticed any neck swelling/thyromegaly. Job asked her to come get checked out.  Past Medical History:  Diagnosis Date  . Chlamydia   . Hypertension     Patient Active Problem List   Diagnosis Date Noted  . Normal labor 04/06/2016    Past Surgical History:  Procedure Laterality Date  . TUBAL LIGATION Bilateral 04/07/2016   Procedure: POST PARTUM TUBAL LIGATION;  Surgeon: Levie Heritage, DO;  Location: [facility];  Service: Gynecology;  Laterality: Bilateral;  . WISDOM TOOTH EXTRACTION       OB History    Gravida  10   Para  4   Term  4   Preterm      AB  5   Living  4     SAB  2   TAB  3   Ectopic      Multiple      Live Births               Home Medications    Prior to Admission medications   Medication Sig Start Date End Date Taking? Authorizing Provider  ibuprofen (ADVIL,MOTRIN) 600 MG tablet Take 1 tablet (600 mg total) by mouth every 6 (six) hours. 04/08/16   Allie Bossier, MD  methocarbamol  (ROBAXIN) 500 MG tablet Take 1 tablet (500 mg total) by mouth every 8 (eight) hours as needed. 05/13/17   Petrucelli, Samantha R, PA-C  naproxen (NAPROSYN) 500 MG tablet Take 1 tablet (500 mg total) by mouth 2 (two) times daily. 05/13/17   Petrucelli, Samantha R, PA-C  oxyCODONE-acetaminophen (PERCOCET/ROXICET) 5-325 MG tablet Take 1-2 tablets by mouth every 6 (six) hours as needed for severe pain. 07/05/18   Petrucelli, Pleas Koch, PA-C  Prenatal Vit-Fe Phos-FA-Omega (VITAFOL GUMMIES) 3.33-0.333-34.8 MG CHEW CHEW 3 GUMMIES EACH DAY 02/07/16   [provider]    Family History No family history on file.  Social History Social History   Tobacco Use  . Smoking status: Current Every Day Smoker    Packs/day: 0.25    Types: Cigarettes  . Smokeless tobacco: Current User  Substance Use Topics  . Alcohol use: No  . Drug use: Yes    Types: Marijuana     Allergies   Patient has no known allergies.   Review of Systems Review of Systems  Constitutional: Positive for fatigue. Negative for chills, fever and unexpected  weight change.  Respiratory: Negative for cough and shortness of breath.   Cardiovascular: Negative for chest pain.  Gastrointestinal: Negative for abdominal pain.  Genitourinary: Negative for dysuria.  All other systems reviewed and are negative.    Physical Exam Updated Vital Signs BP (!) 146/108 (BP Location: Left Arm)   Pulse 64   Temp 98.2 F (36.8 C) (Oral)   Resp 18   Ht 5\' 6"  (1.676 m)   Wt 79.2 kg   LMP 08/17/2018   SpO2 100%   BMI 28.17 kg/m   Physical Exam Vitals signs and nursing note reviewed.  Constitutional:      Appearance: She is well-developed.  HENT:     Head: Normocephalic and atraumatic.     Right Ear: External ear normal.     Left Ear: External ear normal.     Nose: Nose normal.  Eyes:     General:        Right eye: No discharge.        Left eye: No discharge.  Neck:     Thyroid: No thyroid mass, thyromegaly or thyroid  tenderness.  Cardiovascular:     Rate and Rhythm: Normal rate and regular rhythm.     Heart sounds: Normal heart sounds.  Pulmonary:     Effort: Pulmonary effort is normal.     Breath sounds: Normal breath sounds.  Abdominal:     Palpations: Abdomen is soft.     Tenderness: There is no abdominal tenderness.  Skin:    General: Skin is warm and dry.  Neurological:     Mental Status: She is alert.     Comments: CN 3-12 grossly intact. 5/5 strength in all 4 extremities. Grossly normal sensation. Normal finger to nose.   Psychiatric:        Mood and Affect: Mood is not anxious.      ED Treatments / Results  Labs (all labs ordered are listed, but only abnormal results are displayed) Labs Reviewed  CBC WITH DIFFERENTIAL/PLATELET - Abnormal; Notable for the following components:      Result Value   Hemoglobin 10.5 (*)    HCT 35.4 (*)    MCH 24.2 (*)    MCHC 29.7 (*)    All other components within normal limits  COMPREHENSIVE METABOLIC PANEL  PREGNANCY, URINE  TSH    EKG None  Radiology No results found.  Procedures Procedures (including critical care time)  Medications Ordered in ED Medications - No data to display   Initial Impression / Assessment and Plan / ED Course  I have reviewed the triage vital signs and the nursing notes.  Pertinent labs & imaging results that were available during my care of the patient were reviewed by me and considered in my medical decision making (see chart for details).        Patient presents with a feeling of being cold and fatigue though this seems to be mostly when she is at work.  She does feel cold here.  However there is no fever or chills.  No obvious infection.  Her labs are reassuring and she has some anemia that is similar to baseline.  I suggested she go back on iron supplementation.  I have sent a TSH but this does not return in a timely fashion at our facility.  Otherwise needs to establish PCP for follow-up.  Final  Clinical Impressions(s) / ED Diagnoses   Final diagnoses:  Fatigue, unspecified type  Sensation of feeling cold  Anemia,  unspecified type    ED Discharge Orders    None       Pricilla LovelessGoldston, Vernis Eid, MD 08/31/18 1140

## 2018-08-31 NOTE — ED Triage Notes (Addendum)
Pt sts she has been feeling cold and weak for a couple weeks.  Presents in 2 hoodies.  Was at work and wanting to put on more jackets but employer sent to here instead. Takes temp every day at work and has not run a fever.

## 2018-08-31 NOTE — ED Notes (Signed)
ED Provider at bedside. 
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
